# Patient Record
Sex: Female | Born: 1955 | Race: Black or African American | Hispanic: No | Marital: Married | Smoking: Former smoker
Health system: Southern US, Community
[De-identification: ages and names within clinical notes are randomized; demographics above are authoritative.]

## PROBLEM LIST (undated history)

## (undated) DIAGNOSIS — E669 Obesity, unspecified: Secondary | ICD-10-CM

## (undated) DIAGNOSIS — Z9889 Other specified postprocedural states: Secondary | ICD-10-CM

## (undated) DIAGNOSIS — D649 Anemia, unspecified: Secondary | ICD-10-CM

## (undated) DIAGNOSIS — M1710 Unilateral primary osteoarthritis, unspecified knee: Secondary | ICD-10-CM

## (undated) DIAGNOSIS — E7800 Pure hypercholesterolemia, unspecified: Secondary | ICD-10-CM

## (undated) DIAGNOSIS — I10 Essential (primary) hypertension: Secondary | ICD-10-CM

## (undated) DIAGNOSIS — C801 Malignant (primary) neoplasm, unspecified: Secondary | ICD-10-CM

## (undated) HISTORY — DX: Malignant (primary) neoplasm, unspecified: C80.1

## (undated) HISTORY — PX: ESOPHAGOGASTRODUODENOSCOPY: SHX1529

## (undated) HISTORY — PX: OTHER SURGICAL HISTORY: SHX169

---

## 1979-02-14 HISTORY — PX: OTHER SURGICAL HISTORY: SHX169

## 1997-08-06 ENCOUNTER — Other Ambulatory Visit: Admission: RE | Admit: 1997-08-06 | Discharge: 1997-08-06 | Payer: Self-pay | Admitting: Family Medicine

## 1997-12-09 ENCOUNTER — Emergency Department (HOSPITAL_COMMUNITY): Admission: EM | Admit: 1997-12-09 | Discharge: 1997-12-09 | Payer: Self-pay | Admitting: Emergency Medicine

## 1997-12-09 ENCOUNTER — Encounter: Payer: Self-pay | Admitting: Emergency Medicine

## 2002-03-10 ENCOUNTER — Encounter: Payer: Self-pay | Admitting: Emergency Medicine

## 2002-03-10 ENCOUNTER — Emergency Department (HOSPITAL_COMMUNITY): Admission: EM | Admit: 2002-03-10 | Discharge: 2002-03-10 | Payer: Self-pay | Admitting: Emergency Medicine

## 2002-08-25 ENCOUNTER — Emergency Department (HOSPITAL_COMMUNITY): Admission: EM | Admit: 2002-08-25 | Discharge: 2002-08-26 | Payer: Self-pay | Admitting: Emergency Medicine

## 2002-08-26 ENCOUNTER — Encounter: Payer: Self-pay | Admitting: Emergency Medicine

## 2003-03-10 ENCOUNTER — Encounter: Admission: RE | Admit: 2003-03-10 | Discharge: 2003-05-22 | Payer: Self-pay | Admitting: Orthopedic Surgery

## 2003-04-01 ENCOUNTER — Emergency Department (HOSPITAL_COMMUNITY): Admission: EM | Admit: 2003-04-01 | Discharge: 2003-04-01 | Payer: Self-pay | Admitting: Emergency Medicine

## 2003-04-02 ENCOUNTER — Emergency Department (HOSPITAL_COMMUNITY): Admission: EM | Admit: 2003-04-02 | Discharge: 2003-04-02 | Payer: Self-pay | Admitting: Emergency Medicine

## 2003-04-03 ENCOUNTER — Emergency Department (HOSPITAL_COMMUNITY): Admission: EM | Admit: 2003-04-03 | Discharge: 2003-04-03 | Payer: Self-pay | Admitting: Emergency Medicine

## 2003-11-16 ENCOUNTER — Ambulatory Visit: Payer: Self-pay | Admitting: Family Medicine

## 2004-01-05 ENCOUNTER — Ambulatory Visit: Payer: Self-pay | Admitting: Family Medicine

## 2004-01-05 ENCOUNTER — Other Ambulatory Visit: Admission: RE | Admit: 2004-01-05 | Discharge: 2004-01-05 | Payer: Self-pay | Admitting: Family Medicine

## 2004-03-16 ENCOUNTER — Ambulatory Visit: Payer: Self-pay | Admitting: Family Medicine

## 2004-06-10 ENCOUNTER — Ambulatory Visit: Payer: Self-pay | Admitting: Family Medicine

## 2004-08-05 ENCOUNTER — Emergency Department (HOSPITAL_COMMUNITY): Admission: EM | Admit: 2004-08-05 | Discharge: 2004-08-05 | Payer: Self-pay | Admitting: Emergency Medicine

## 2004-08-09 ENCOUNTER — Ambulatory Visit: Payer: Self-pay | Admitting: *Deleted

## 2004-12-07 ENCOUNTER — Ambulatory Visit: Payer: Self-pay | Admitting: Family Medicine

## 2005-02-01 ENCOUNTER — Ambulatory Visit (HOSPITAL_COMMUNITY): Admission: RE | Admit: 2005-02-01 | Discharge: 2005-02-01 | Payer: Self-pay | Admitting: Internal Medicine

## 2005-02-01 ENCOUNTER — Ambulatory Visit: Payer: Self-pay | Admitting: Family Medicine

## 2005-02-09 ENCOUNTER — Ambulatory Visit (HOSPITAL_COMMUNITY): Admission: RE | Admit: 2005-02-09 | Discharge: 2005-02-09 | Payer: Self-pay | Admitting: Internal Medicine

## 2005-02-13 HISTORY — PX: KNEE ARTHROSCOPY: SUR90

## 2005-07-03 ENCOUNTER — Ambulatory Visit: Payer: Self-pay | Admitting: Family Medicine

## 2005-08-10 ENCOUNTER — Ambulatory Visit: Payer: Self-pay | Admitting: Family Medicine

## 2005-09-06 ENCOUNTER — Ambulatory Visit: Payer: Self-pay | Admitting: Family Medicine

## 2005-09-29 ENCOUNTER — Encounter: Admission: RE | Admit: 2005-09-29 | Discharge: 2005-11-09 | Payer: Self-pay | Admitting: Family Medicine

## 2005-10-16 ENCOUNTER — Emergency Department (HOSPITAL_COMMUNITY): Admission: EM | Admit: 2005-10-16 | Discharge: 2005-10-16 | Payer: Self-pay | Admitting: Emergency Medicine

## 2005-10-17 ENCOUNTER — Ambulatory Visit: Payer: Self-pay | Admitting: Family Medicine

## 2005-11-28 ENCOUNTER — Ambulatory Visit: Payer: Self-pay | Admitting: Family Medicine

## 2005-11-29 ENCOUNTER — Encounter: Admission: RE | Admit: 2005-11-29 | Discharge: 2006-02-27 | Payer: Self-pay | Admitting: Family Medicine

## 2006-01-16 ENCOUNTER — Ambulatory Visit: Payer: Self-pay | Admitting: Family Medicine

## 2006-03-26 ENCOUNTER — Encounter: Admission: RE | Admit: 2006-03-26 | Discharge: 2006-06-24 | Payer: Self-pay | Admitting: Family Medicine

## 2006-04-03 ENCOUNTER — Encounter: Admission: RE | Admit: 2006-04-03 | Discharge: 2006-05-21 | Payer: Self-pay | Admitting: Orthopaedic Surgery

## 2006-07-31 ENCOUNTER — Ambulatory Visit: Payer: Self-pay | Admitting: Family Medicine

## 2006-11-27 ENCOUNTER — Ambulatory Visit: Payer: Self-pay | Admitting: Family Medicine

## 2007-01-05 IMAGING — CR DG CHEST 2V
2 series · 2 of 2 positions shown · non-contrast
Comparison: 04/03/2003

CLINICAL DATA: Chest pain

CHEST - 2 VIEW:

[w chest pa]
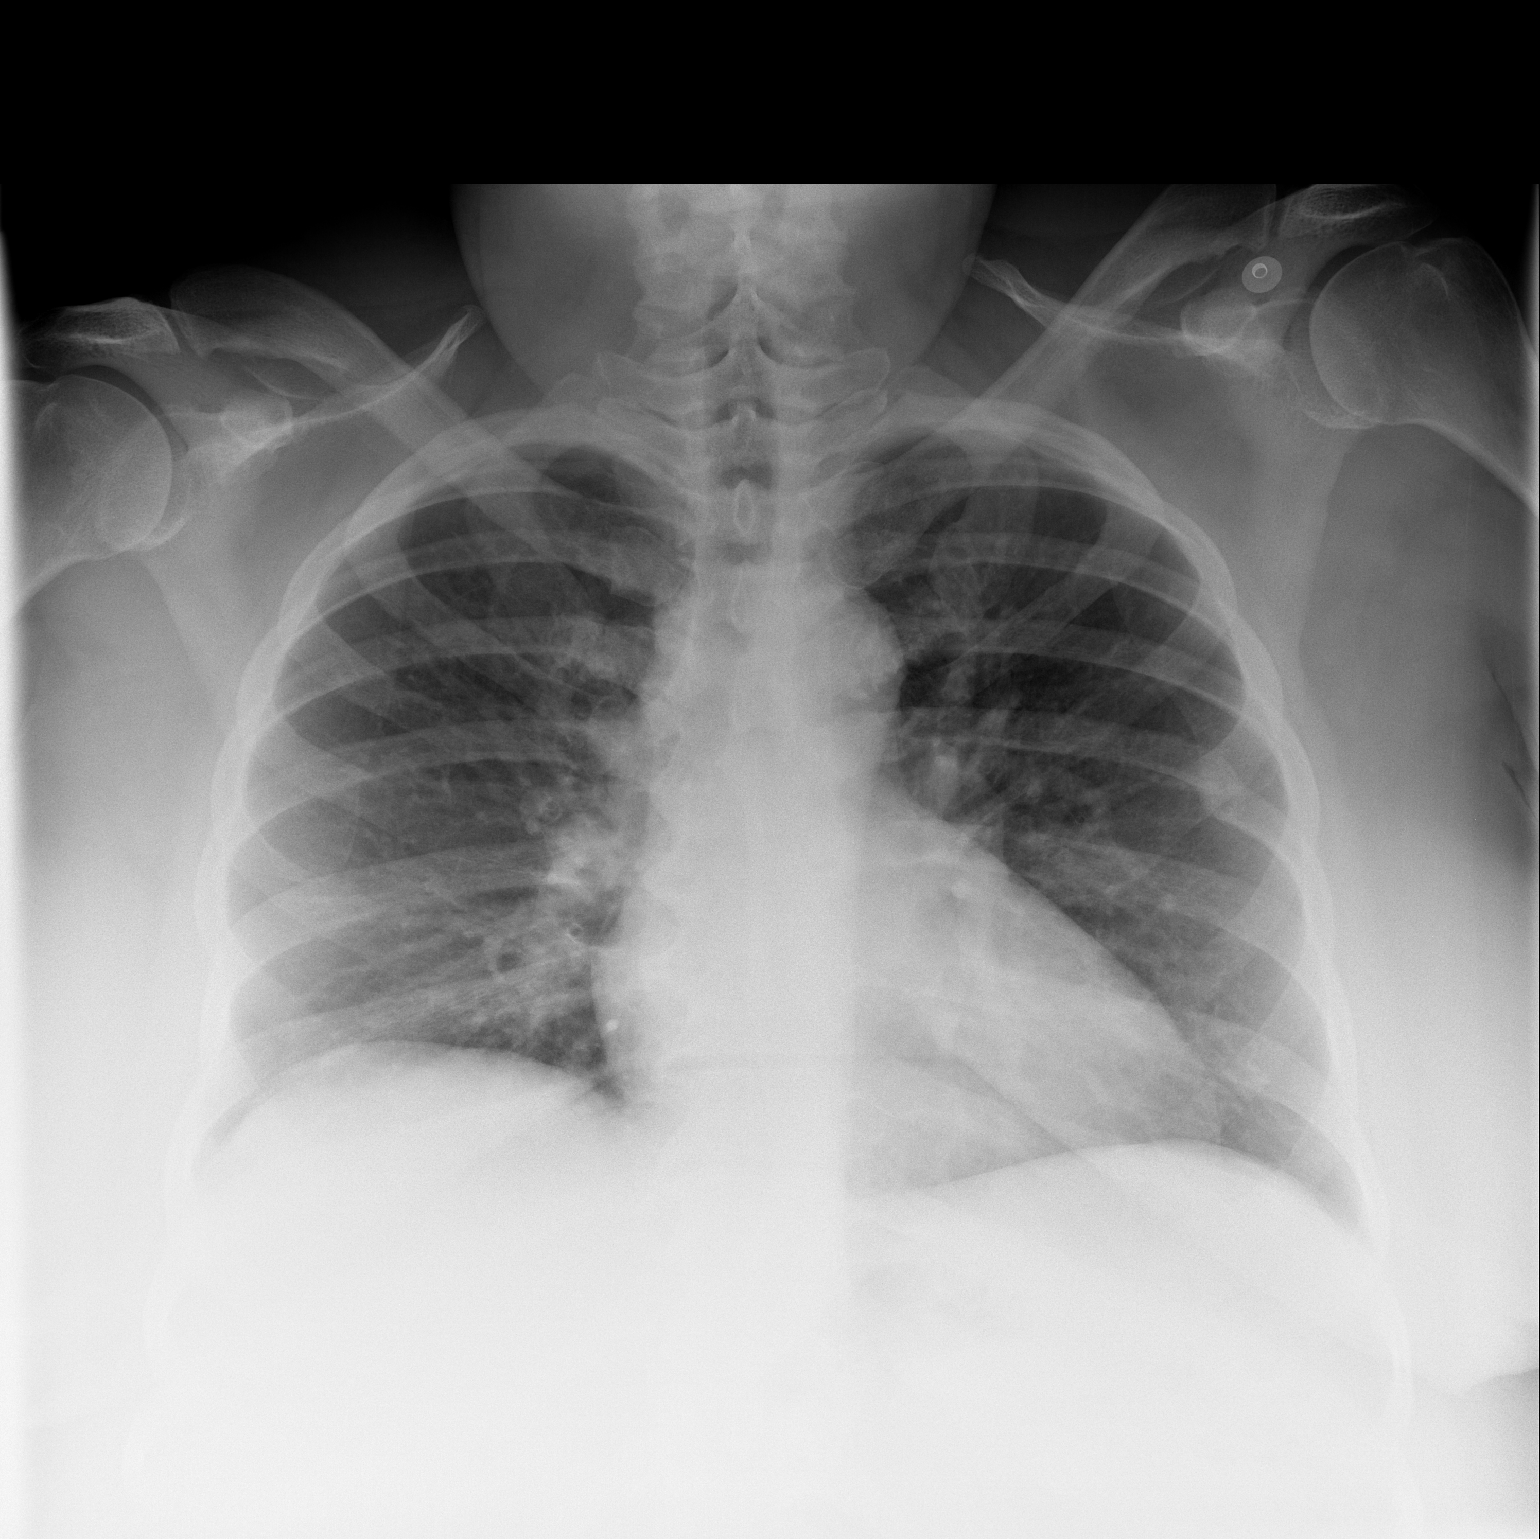

[w chest lat]
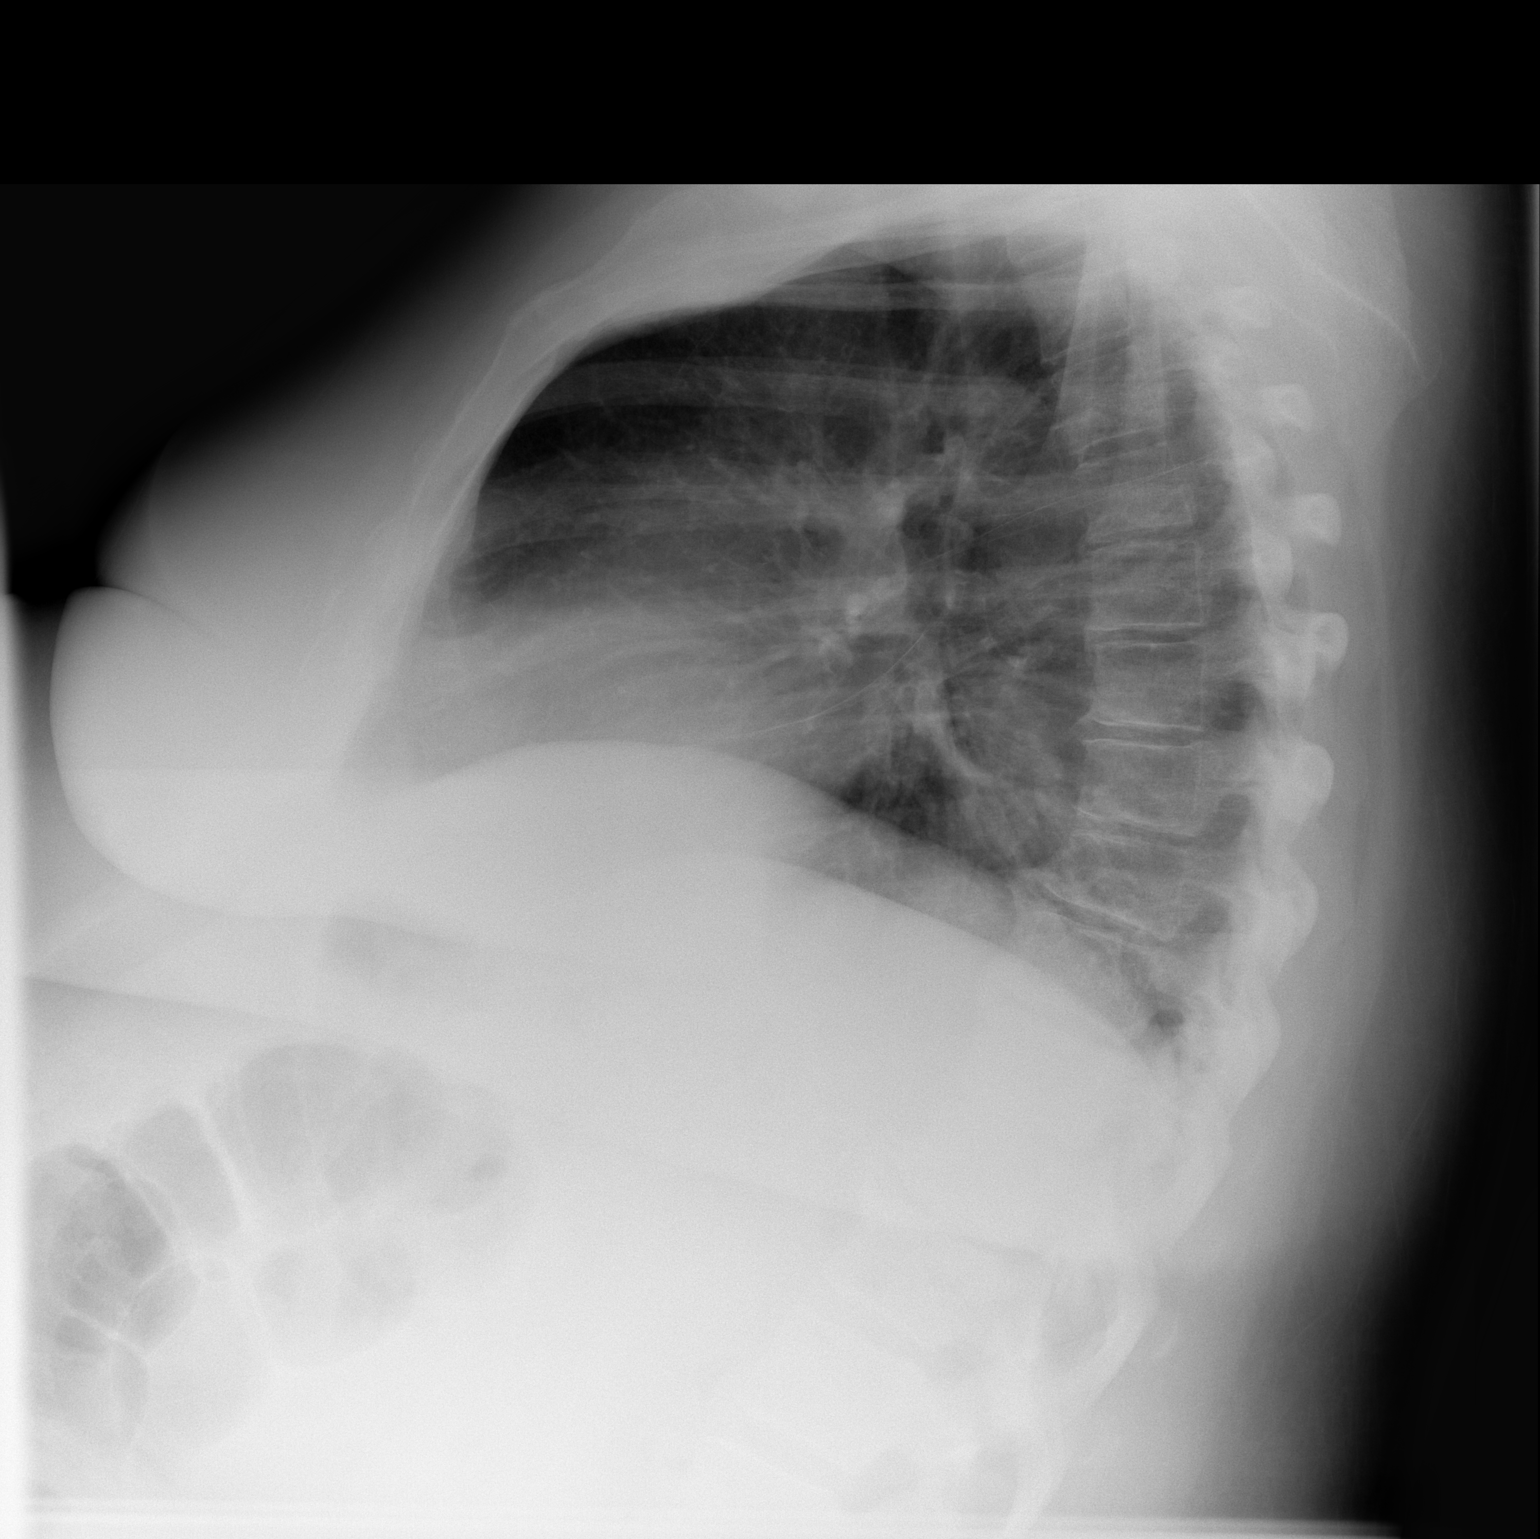

[2 of 2 positions shown; findings below may reference images not displayed]

FINDINGS: Heart is mildly enlarged. There is peribronchial thickening. No focal
opacities or effusions. Old left 6th rib fracture again noted, unchanged.
IMPRESSION: Mild cardiomegaly and bronchitic changes.

## 2007-01-08 ENCOUNTER — Ambulatory Visit: Payer: Self-pay | Admitting: Internal Medicine

## 2007-01-08 ENCOUNTER — Encounter (INDEPENDENT_AMBULATORY_CARE_PROVIDER_SITE_OTHER): Payer: Self-pay | Admitting: Family Medicine

## 2007-01-08 LAB — CONVERTED CEMR LAB
BUN: 20 mg/dL (ref 6–23)
CO2: 23 meq/L (ref 19–32)
Cholesterol: 154 mg/dL (ref 0–200)
Creatinine, Ser: 0.92 mg/dL (ref 0.40–1.20)
Glucose, Bld: 369 mg/dL — ABNORMAL HIGH (ref 70–99)
HDL: 51 mg/dL (ref >39)
Total Bilirubin: 0.3 mg/dL (ref 0.3–1.2)
Total CHOL/HDL Ratio: 3
Total Protein: 7.3 g/dL (ref 6.0–8.3)
Triglycerides: 147 mg/dL (ref <150)
VLDL: 29 mg/dL (ref 0–40)

## 2007-01-29 ENCOUNTER — Ambulatory Visit: Payer: Self-pay | Admitting: Internal Medicine

## 2007-02-12 ENCOUNTER — Ambulatory Visit: Payer: Self-pay | Admitting: Family Medicine

## 2007-04-30 ENCOUNTER — Ambulatory Visit: Payer: Self-pay | Admitting: Family Medicine

## 2007-05-09 ENCOUNTER — Emergency Department (HOSPITAL_COMMUNITY): Admission: EM | Admit: 2007-05-09 | Discharge: 2007-05-09 | Payer: Self-pay | Admitting: Family Medicine

## 2007-06-10 ENCOUNTER — Ambulatory Visit: Payer: Self-pay | Admitting: Internal Medicine

## 2007-07-01 ENCOUNTER — Ambulatory Visit: Payer: Self-pay | Admitting: Internal Medicine

## 2007-09-05 ENCOUNTER — Ambulatory Visit: Payer: Self-pay | Admitting: Internal Medicine

## 2007-09-09 ENCOUNTER — Ambulatory Visit: Payer: Self-pay | Admitting: Internal Medicine

## 2007-09-11 ENCOUNTER — Ambulatory Visit: Payer: Self-pay | Admitting: Internal Medicine

## 2007-09-13 ENCOUNTER — Ambulatory Visit: Payer: Self-pay | Admitting: Internal Medicine

## 2007-09-20 ENCOUNTER — Ambulatory Visit: Payer: Self-pay | Admitting: Internal Medicine

## 2007-09-23 ENCOUNTER — Ambulatory Visit: Payer: Self-pay | Admitting: Internal Medicine

## 2007-09-25 ENCOUNTER — Ambulatory Visit: Payer: Self-pay | Admitting: Surgery

## 2007-09-25 ENCOUNTER — Encounter (INDEPENDENT_AMBULATORY_CARE_PROVIDER_SITE_OTHER): Payer: Self-pay | Admitting: Emergency Medicine

## 2007-09-25 ENCOUNTER — Emergency Department (HOSPITAL_COMMUNITY): Admission: EM | Admit: 2007-09-25 | Discharge: 2007-09-25 | Payer: Self-pay | Admitting: Emergency Medicine

## 2007-09-27 ENCOUNTER — Ambulatory Visit: Payer: Self-pay | Admitting: Internal Medicine

## 2007-09-28 ENCOUNTER — Encounter: Payer: Self-pay | Admitting: Family Medicine

## 2007-09-30 ENCOUNTER — Ambulatory Visit: Payer: Self-pay | Admitting: Internal Medicine

## 2007-10-02 ENCOUNTER — Ambulatory Visit: Payer: Self-pay | Admitting: Internal Medicine

## 2007-10-04 ENCOUNTER — Ambulatory Visit: Payer: Self-pay | Admitting: Internal Medicine

## 2007-10-07 ENCOUNTER — Ambulatory Visit: Payer: Self-pay | Admitting: Family Medicine

## 2007-10-09 ENCOUNTER — Ambulatory Visit: Payer: Self-pay | Admitting: Internal Medicine

## 2007-10-11 ENCOUNTER — Ambulatory Visit: Payer: Self-pay | Admitting: Internal Medicine

## 2007-10-17 ENCOUNTER — Ambulatory Visit: Payer: Self-pay | Admitting: Internal Medicine

## 2007-10-24 ENCOUNTER — Ambulatory Visit: Payer: Self-pay | Admitting: Internal Medicine

## 2007-10-30 ENCOUNTER — Ambulatory Visit: Payer: Self-pay | Admitting: Internal Medicine

## 2007-11-05 ENCOUNTER — Ambulatory Visit: Payer: Self-pay | Admitting: Internal Medicine

## 2007-11-07 ENCOUNTER — Ambulatory Visit: Payer: Self-pay | Admitting: Internal Medicine

## 2007-11-11 ENCOUNTER — Ambulatory Visit: Payer: Self-pay | Admitting: Internal Medicine

## 2007-11-15 ENCOUNTER — Ambulatory Visit: Payer: Self-pay | Admitting: Internal Medicine

## 2007-11-20 ENCOUNTER — Ambulatory Visit: Payer: Self-pay | Admitting: Internal Medicine

## 2007-11-27 ENCOUNTER — Ambulatory Visit: Payer: Self-pay | Admitting: Internal Medicine

## 2007-12-03 ENCOUNTER — Ambulatory Visit: Payer: Self-pay | Admitting: Internal Medicine

## 2007-12-10 ENCOUNTER — Ambulatory Visit: Payer: Self-pay | Admitting: Internal Medicine

## 2007-12-17 ENCOUNTER — Ambulatory Visit: Payer: Self-pay | Admitting: Internal Medicine

## 2007-12-25 ENCOUNTER — Ambulatory Visit: Payer: Self-pay | Admitting: Internal Medicine

## 2007-12-30 ENCOUNTER — Ambulatory Visit: Payer: Self-pay | Admitting: Internal Medicine

## 2008-01-06 ENCOUNTER — Ambulatory Visit: Payer: Self-pay | Admitting: Internal Medicine

## 2008-01-14 ENCOUNTER — Ambulatory Visit: Payer: Self-pay | Admitting: Internal Medicine

## 2008-01-23 ENCOUNTER — Ambulatory Visit: Payer: Self-pay | Admitting: Internal Medicine

## 2008-02-03 ENCOUNTER — Ambulatory Visit: Payer: Self-pay | Admitting: Internal Medicine

## 2008-02-11 ENCOUNTER — Ambulatory Visit: Payer: Self-pay | Admitting: Internal Medicine

## 2008-02-11 ENCOUNTER — Encounter (INDEPENDENT_AMBULATORY_CARE_PROVIDER_SITE_OTHER): Payer: Self-pay | Admitting: Family Medicine

## 2008-02-11 LAB — CONVERTED CEMR LAB
ALT: 13 units/L (ref 0–35)
AST: 13 units/L (ref 0–37)
Basophils Absolute: 0 10*3/uL (ref 0.0–0.1)
Basophils Relative: 0 % (ref 0–1)
Chloride: 108 meq/L (ref 96–112)
Creatinine, Ser: 0.79 mg/dL (ref 0.40–1.20)
Hemoglobin: 10.6 g/dL — ABNORMAL LOW (ref 12.0–15.0)
MCHC: 30 g/dL (ref 30.0–36.0)
Microalb, Ur: 1.53 mg/dL (ref 0.00–1.89)
Monocytes Absolute: 0.6 10*3/uL (ref 0.1–1.0)
Neutro Abs: 2.6 10*3/uL (ref 1.7–7.7)
Platelets: 254 10*3/uL (ref 150–400)
RDW: 15.5 % (ref 11.5–15.5)
TSH: 3.307 microintl units/mL (ref 0.350–4.50)
Total Bilirubin: 0.3 mg/dL (ref 0.3–1.2)
Total CHOL/HDL Ratio: 2.8
VLDL: 17 mg/dL (ref 0–40)

## 2008-02-12 ENCOUNTER — Encounter (INDEPENDENT_AMBULATORY_CARE_PROVIDER_SITE_OTHER): Payer: Self-pay | Admitting: Family Medicine

## 2008-02-17 ENCOUNTER — Ambulatory Visit: Payer: Self-pay | Admitting: Internal Medicine

## 2008-02-21 ENCOUNTER — Encounter: Payer: Self-pay | Admitting: Family Medicine

## 2008-02-21 ENCOUNTER — Ambulatory Visit: Payer: Self-pay | Admitting: Internal Medicine

## 2008-02-24 ENCOUNTER — Ambulatory Visit: Payer: Self-pay | Admitting: Internal Medicine

## 2008-03-06 ENCOUNTER — Ambulatory Visit (HOSPITAL_COMMUNITY): Admission: RE | Admit: 2008-03-06 | Discharge: 2008-03-06 | Payer: Self-pay | Admitting: Internal Medicine

## 2008-03-11 ENCOUNTER — Encounter (HOSPITAL_BASED_OUTPATIENT_CLINIC_OR_DEPARTMENT_OTHER): Admission: RE | Admit: 2008-03-11 | Discharge: 2008-06-09 | Payer: Self-pay | Admitting: Internal Medicine

## 2008-03-27 ENCOUNTER — Encounter: Admission: RE | Admit: 2008-03-27 | Discharge: 2008-06-25 | Payer: Self-pay | Admitting: Family Medicine

## 2008-04-15 ENCOUNTER — Ambulatory Visit: Payer: Self-pay | Admitting: Internal Medicine

## 2008-06-12 ENCOUNTER — Encounter (HOSPITAL_BASED_OUTPATIENT_CLINIC_OR_DEPARTMENT_OTHER): Admission: RE | Admit: 2008-06-12 | Discharge: 2008-09-10 | Payer: Self-pay | Admitting: Internal Medicine

## 2008-07-06 ENCOUNTER — Encounter: Admission: RE | Admit: 2008-07-06 | Discharge: 2008-10-04 | Payer: Self-pay | Admitting: Family Medicine

## 2008-07-15 ENCOUNTER — Ambulatory Visit: Payer: Self-pay | Admitting: Family Medicine

## 2008-07-15 LAB — CONVERTED CEMR LAB
ALT: 17 units/L (ref 0–35)
AST: 18 units/L (ref 0–37)
Alkaline Phosphatase: 62 units/L (ref 39–117)
Calcium: 10.2 mg/dL (ref 8.4–10.5)
Chloride: 107 meq/L (ref 96–112)
Creatinine, Ser: 0.8 mg/dL (ref 0.40–1.20)
Eosinophils Absolute: 0 10*3/uL (ref 0.0–0.7)
Iron: 44 ug/dL (ref 42–145)
Lymphocytes Relative: 37 % (ref 12–46)
Lymphs Abs: 1.8 10*3/uL (ref 0.7–4.0)
Neutrophils Relative %: 53 % (ref 43–77)
Platelets: 271 10*3/uL (ref 150–400)
Potassium: 4.1 meq/L (ref 3.5–5.3)
Saturation Ratios: 11 % — ABNORMAL LOW (ref 20–55)
UIBC: 352 ug/dL
WBC: 4.7 10*3/uL (ref 4.0–10.5)

## 2008-07-22 ENCOUNTER — Ambulatory Visit: Payer: Self-pay | Admitting: Internal Medicine

## 2008-08-16 ENCOUNTER — Emergency Department (HOSPITAL_COMMUNITY): Admission: EM | Admit: 2008-08-16 | Discharge: 2008-08-17 | Payer: Self-pay | Admitting: Emergency Medicine

## 2008-09-14 ENCOUNTER — Encounter (HOSPITAL_BASED_OUTPATIENT_CLINIC_OR_DEPARTMENT_OTHER): Admission: RE | Admit: 2008-09-14 | Discharge: 2008-10-21 | Payer: Self-pay | Admitting: Internal Medicine

## 2008-09-18 ENCOUNTER — Telehealth (INDEPENDENT_AMBULATORY_CARE_PROVIDER_SITE_OTHER): Payer: Self-pay | Admitting: *Deleted

## 2008-09-21 ENCOUNTER — Ambulatory Visit: Payer: Self-pay | Admitting: Internal Medicine

## 2008-09-22 ENCOUNTER — Emergency Department (HOSPITAL_COMMUNITY): Admission: EM | Admit: 2008-09-22 | Discharge: 2008-09-22 | Payer: Self-pay | Admitting: Family Medicine

## 2008-09-28 ENCOUNTER — Ambulatory Visit (HOSPITAL_COMMUNITY): Admission: RE | Admit: 2008-09-28 | Discharge: 2008-09-28 | Payer: Self-pay | Admitting: Internal Medicine

## 2008-09-28 ENCOUNTER — Ambulatory Visit: Payer: Self-pay | Admitting: Internal Medicine

## 2008-09-28 ENCOUNTER — Telehealth (INDEPENDENT_AMBULATORY_CARE_PROVIDER_SITE_OTHER): Payer: Self-pay | Admitting: *Deleted

## 2008-11-06 ENCOUNTER — Ambulatory Visit: Payer: Self-pay | Admitting: Internal Medicine

## 2008-11-13 ENCOUNTER — Ambulatory Visit (HOSPITAL_COMMUNITY): Admission: RE | Admit: 2008-11-13 | Discharge: 2008-11-13 | Payer: Self-pay | Admitting: Family Medicine

## 2008-11-17 ENCOUNTER — Ambulatory Visit: Payer: Self-pay | Admitting: Gastroenterology

## 2008-11-18 ENCOUNTER — Encounter: Payer: Self-pay | Admitting: Family Medicine

## 2008-11-18 ENCOUNTER — Ambulatory Visit: Payer: Self-pay | Admitting: Internal Medicine

## 2008-11-18 LAB — CONVERTED CEMR LAB
Alkaline Phosphatase: 50 units/L (ref 39–117)
BUN: 24 mg/dL — ABNORMAL HIGH (ref 6–23)
Basophils Absolute: 0 10*3/uL (ref 0.0–0.1)
Basophils Relative: 1 % (ref 0–1)
CO2: 26 meq/L (ref 19–32)
Cholesterol: 107 mg/dL (ref 0–200)
Creatinine, Ser: 0.97 mg/dL (ref 0.40–1.20)
Eosinophils Absolute: 0.1 10*3/uL (ref 0.0–0.7)
Eosinophils Relative: 1 % (ref 0–5)
Glucose, Bld: 88 mg/dL (ref 70–99)
HDL: 48 mg/dL (ref >39)
Hemoglobin: 11.1 g/dL — ABNORMAL LOW (ref 12.0–15.0)
LDL Cholesterol: 48 mg/dL (ref 0–99)
MCHC: 29.9 g/dL — ABNORMAL LOW (ref 30.0–36.0)
MCV: 89 fL (ref 78.0–100.0)
Monocytes Absolute: 0.5 10*3/uL (ref 0.1–1.0)
Neutro Abs: 1.8 10*3/uL (ref 1.7–7.7)
RDW: 15.6 % — ABNORMAL HIGH (ref 11.5–15.5)
Sodium: 144 meq/L (ref 135–145)
Total Bilirubin: 0.3 mg/dL (ref 0.3–1.2)
Triglycerides: 56 mg/dL (ref <150)
VLDL: 11 mg/dL (ref 0–40)

## 2008-11-30 ENCOUNTER — Ambulatory Visit: Payer: Self-pay | Admitting: Gastroenterology

## 2008-12-31 ENCOUNTER — Encounter: Admission: RE | Admit: 2008-12-31 | Discharge: 2009-02-10 | Payer: Self-pay | Admitting: Family Medicine

## 2009-01-15 ENCOUNTER — Ambulatory Visit: Payer: Self-pay | Admitting: Internal Medicine

## 2009-01-15 ENCOUNTER — Encounter: Payer: Self-pay | Admitting: Family Medicine

## 2009-01-20 ENCOUNTER — Ambulatory Visit: Payer: Self-pay | Admitting: Obstetrics and Gynecology

## 2009-01-20 ENCOUNTER — Other Ambulatory Visit: Admission: RE | Admit: 2009-01-20 | Discharge: 2009-01-20 | Payer: Self-pay | Admitting: Obstetrics and Gynecology

## 2009-02-03 ENCOUNTER — Ambulatory Visit: Payer: Self-pay | Admitting: Obstetrics & Gynecology

## 2009-02-25 ENCOUNTER — Encounter: Admission: RE | Admit: 2009-02-25 | Discharge: 2009-05-26 | Payer: Self-pay | Admitting: Endocrinology

## 2009-03-29 ENCOUNTER — Ambulatory Visit (HOSPITAL_COMMUNITY): Admission: RE | Admit: 2009-03-29 | Discharge: 2009-03-29 | Payer: Self-pay | Admitting: Internal Medicine

## 2009-04-15 ENCOUNTER — Ambulatory Visit: Payer: Self-pay | Admitting: Internal Medicine

## 2009-05-17 ENCOUNTER — Ambulatory Visit: Payer: Self-pay | Admitting: Internal Medicine

## 2009-08-31 ENCOUNTER — Ambulatory Visit: Payer: Self-pay | Admitting: Family Medicine

## 2009-09-27 ENCOUNTER — Encounter: Admission: RE | Admit: 2009-09-27 | Discharge: 2009-11-12 | Payer: Self-pay | Admitting: Family Medicine

## 2009-10-01 ENCOUNTER — Ambulatory Visit: Payer: Self-pay | Admitting: Internal Medicine

## 2009-10-01 LAB — CONVERTED CEMR LAB
AST: 15 units/L (ref 0–37)
Albumin: 4.3 g/dL (ref 3.5–5.2)
Alkaline Phosphatase: 60 units/L (ref 39–117)
BUN: 20 mg/dL (ref 6–23)
Basophils Relative: 1 % (ref 0–1)
Calcium: 9.8 mg/dL (ref 8.4–10.5)
Chloride: 107 meq/L (ref 96–112)
Eosinophils Absolute: 0 10*3/uL (ref 0.0–0.7)
Ferritin: 21 ng/mL (ref 10–291)
Free T4: 1.18 ng/dL (ref 0.80–1.80)
HDL: 53 mg/dL (ref >39)
Hgb A1c MFr Bld: 8.3 % — ABNORMAL HIGH (ref <5.7)
Iron: 53 ug/dL (ref 42–145)
LDL Cholesterol: 55 mg/dL (ref 0–99)
Lymphs Abs: 1.6 10*3/uL (ref 0.7–4.0)
MCHC: 30.9 g/dL (ref 30.0–36.0)
MCV: 87.4 fL (ref 78.0–100.0)
Monocytes Relative: 10 % (ref 3–12)
Neutro Abs: 2.1 10*3/uL (ref 1.7–7.7)
Neutrophils Relative %: 50 % (ref 43–77)
Platelets: 247 10*3/uL (ref 150–400)
Potassium: 4.4 meq/L (ref 3.5–5.3)
RBC: 4.37 M/uL (ref 3.87–5.11)
Sodium: 142 meq/L (ref 135–145)
TIBC: 386 ug/dL (ref 250–470)
TSH: 3.964 microintl units/mL (ref 0.350–4.500)
Total Protein: 7.2 g/dL (ref 6.0–8.3)
UIBC: 333 ug/dL
Uric Acid, Serum: 5.3 mg/dL (ref 2.4–7.0)
WBC: 4.2 10*3/uL (ref 4.0–10.5)

## 2009-10-15 ENCOUNTER — Ambulatory Visit: Payer: Self-pay | Admitting: Internal Medicine

## 2009-10-19 ENCOUNTER — Ambulatory Visit: Payer: Self-pay | Admitting: Internal Medicine

## 2009-10-22 ENCOUNTER — Ambulatory Visit: Payer: Self-pay | Admitting: Internal Medicine

## 2009-12-10 ENCOUNTER — Emergency Department (HOSPITAL_COMMUNITY): Admission: EM | Admit: 2009-12-10 | Discharge: 2009-12-10 | Payer: Self-pay | Admitting: Family Medicine

## 2010-03-06 ENCOUNTER — Encounter: Payer: Self-pay | Admitting: Internal Medicine

## 2010-03-07 ENCOUNTER — Encounter (INDEPENDENT_AMBULATORY_CARE_PROVIDER_SITE_OTHER): Payer: Self-pay | Admitting: *Deleted

## 2010-03-11 ENCOUNTER — Other Ambulatory Visit (HOSPITAL_COMMUNITY): Payer: Self-pay | Admitting: Family Medicine

## 2010-03-11 DIAGNOSIS — Z139 Encounter for screening, unspecified: Secondary | ICD-10-CM

## 2010-03-11 DIAGNOSIS — Z1231 Encounter for screening mammogram for malignant neoplasm of breast: Secondary | ICD-10-CM

## 2010-03-30 ENCOUNTER — Ambulatory Visit (HOSPITAL_COMMUNITY)
Admission: RE | Admit: 2010-03-30 | Discharge: 2010-03-30 | Disposition: A | Discharge status: Home / Self Care | Payer: Medicare Other | Source: Ambulatory Visit | Attending: Family Medicine | Admitting: Family Medicine

## 2010-03-30 ENCOUNTER — Ambulatory Visit (HOSPITAL_COMMUNITY): Admission: RE | Admit: 2010-03-30 | Payer: Self-pay | Source: Home / Self Care | Admitting: Internal Medicine

## 2010-03-30 DIAGNOSIS — Z1231 Encounter for screening mammogram for malignant neoplasm of breast: Secondary | ICD-10-CM | POA: Insufficient documentation

## 2010-04-27 LAB — POCT URINALYSIS DIPSTICK
Hgb urine dipstick: NEGATIVE
Nitrite: NEGATIVE
Protein, ur: NEGATIVE mg/dL
Specific Gravity, Urine: 1.015 (ref 1.005–1.030)
Urobilinogen, UA: 0.2 mg/dL (ref 0.0–1.0)
pH: 6 (ref 5.0–8.0)

## 2010-05-19 LAB — GLUCOSE, CAPILLARY: Glucose-Capillary: 82 mg/dL (ref 70–99)

## 2010-05-22 LAB — DIFFERENTIAL
Eosinophils Absolute: 0.1 10*3/uL (ref 0.0–0.7)
Eosinophils Relative: 1 % (ref 0–5)
Lymphs Abs: 2 10*3/uL (ref 0.7–4.0)
Monocytes Relative: 9 % (ref 3–12)

## 2010-05-22 LAB — COMPREHENSIVE METABOLIC PANEL
ALT: 20 U/L (ref 0–35)
AST: 30 U/L (ref 0–37)
Alkaline Phosphatase: 65 U/L (ref 39–117)
CO2: 26 mEq/L (ref 19–32)
Calcium: 9.7 mg/dL (ref 8.4–10.5)
GFR calc Af Amer: 44 mL/min — ABNORMAL LOW (ref >60)
Potassium: 3.8 mEq/L (ref 3.5–5.1)
Sodium: 140 mEq/L (ref 135–145)
Total Protein: 7.7 g/dL (ref 6.0–8.3)

## 2010-05-22 LAB — URINALYSIS, ROUTINE W REFLEX MICROSCOPIC
Hgb urine dipstick: NEGATIVE
Specific Gravity, Urine: 1.023 (ref 1.005–1.030)
Urobilinogen, UA: 1 mg/dL (ref 0.0–1.0)
pH: 5.5 (ref 5.0–8.0)

## 2010-05-22 LAB — CBC
MCHC: 32.6 g/dL (ref 30.0–36.0)
RBC: 4.71 MIL/uL (ref 3.87–5.11)
RDW: 15.9 % — ABNORMAL HIGH (ref 11.5–15.5)

## 2010-05-22 LAB — URINE MICROSCOPIC-ADD ON

## 2010-06-23 ENCOUNTER — Inpatient Hospital Stay (INDEPENDENT_AMBULATORY_CARE_PROVIDER_SITE_OTHER)
Admission: RE | Admit: 2010-06-23 | Discharge: 2010-06-23 | Disposition: A | Discharge status: Home / Self Care | Payer: Medicare Other | Source: Ambulatory Visit | Attending: Family Medicine | Admitting: Family Medicine

## 2010-06-23 ENCOUNTER — Ambulatory Visit (INDEPENDENT_AMBULATORY_CARE_PROVIDER_SITE_OTHER): Payer: Medicare Other

## 2010-06-23 DIAGNOSIS — M79609 Pain in unspecified limb: Secondary | ICD-10-CM

## 2010-06-28 NOTE — Assessment & Plan Note (Signed)
[facility]   NAME:  Katie West, Katie West               ACCOUNT NO.:  [id number]   MEDICAL RECORD NO.:  [id number]      DATE OF BIRTH:  07/07/1955   PHYSICIAN:  Maxwell Caul, M.D.      VISIT DATE:                                   OFFICE VISIT   Katie West is a lady we have been following for venous stasis  ulceration on her left lower extremity.  The patient has been receiving  Prisma under a Profore wrap.  She appears to be tolerating this fairly  well and there are no specific complaints.   On examination, she is afebrile with a temperature of 98.1.  Her glucose  is 186.  The area on the left anterior lower extremity measures 0.3 x  0.3 x 0.1.  There is surrounding significant stasis change; however, she  only has a small open area at this point.   IMPRESSION:  Improving venous stasis ulceration.  We applied hydrogel  gauze, Adaptic, and a Profore wrap.  We will see her back in a week.  I  am hopeful this area will close down in that time frame.           ______________________________  Maxwell Caul, M.D.     MGR/MEDQ  D:  05/28/2008  T:  05/29/2008  Job:  [id number]

## 2010-06-28 NOTE — Assessment & Plan Note (Signed)
[facility]   NAME:  Katie West, Katie West               ACCOUNT NO.:  [id number]   MEDICAL RECORD NO.:  [id number]      DATE OF BIRTH:  [birth year]   PHYSICIAN:  Leonie Man, M.D.    VISIT DATE:  07/06/2008                                   OFFICE VISIT   PROBLEM:  Venous leg ulcer of left lower extremity.  Current wound  dimensions 1.3 x 1.4 x 0.1, which is significantly reduced on the  current therapy of hydrogel and a Profore Lite wrap.   The patient returns today for reevaluation.  The wound continues to be  clean and fully granulated not requiring any debridement on today's  evaluation.  We will continue her on compression therapy with Profore  Lite wrap, hydrogel, and Adaptic dressing.  She is scheduled for  followup in 1 week.      Leonie Man, M.D.  Electronically Signed     PB/MEDQ  D:  07/08/2008  T:  07/08/2008  Job:  [id number]

## 2010-06-28 NOTE — Assessment & Plan Note (Signed)
[facility]   NAME:  Katie West, Katie West               ACCOUNT NO.:  [id number]   MEDICAL RECORD NO.:  [id number]      DATE OF BIRTH:  Aug 27, 1955   PHYSICIAN:  Leonie Man, M.D.    VISIT DATE:  06/09/2008                                   OFFICE VISIT   PROBLEM:  Venous stasis disease of the left leg in this 55 year old  female, currently being treated with collagen hydrogel and with Profore  wraps.  She is here for followup today.   On examination, the patient is feeling well.  She is afebrile.  Temperature 98, pulse 92, respirations 16, blood pressure 122/81,  capillary blood glucose measured at 193 today.  On evaluation, the wound  of the left anterior leg measures 1.3 x 1.5 x 0.1 which is essentially  unchanged from her most recent visit 1 week ago.  The wound base is,  however, clean without any evidence of exudate, odor, or fibrin.   Additionally, the patient shows a blister over the third toe of her left  foot which she has noted just in the past 24 hours.  The skin of this  area is not broken, and there is no evidence of infection.   Treatment today will consist for her venous leg ulcer of hydrogel under  a Profore dressing.  The toe blister will be treated with a toe sock.  She should be followed up in 1 week when she should return to see Dr.  Baltazar Najjar.      Leonie Man, M.D.  Electronically Signed     PB/MEDQ  D:  06/09/2008  T:  06/10/2008  Job:  [id number]

## 2010-06-28 NOTE — Assessment & Plan Note (Signed)
[facility]   NAME:  Katie West, Katie West               ACCOUNT NO.:  [id number]   MEDICAL RECORD NO.:  [id number]      DATE OF BIRTH:  1956/02/06   PHYSICIAN:  Joanne Gavel, M.D.         VISIT DATE:  08/19/2008                                   OFFICE VISIT   HISTORY OF PRESENT ILLNESS:  A 55 year old female with venous stasis of  the left lower extremity.  There is an ulceration, now 1.9 x 0.9 and it  is quite superficial.   Physical examination reveals temperature 98.2, pulse 100, respirations  20, and blood pressure 108/73.   The ulceration is clean with a great deal of granulation tissue.  This  was cauterized with silver nitrate as I think it is hypertrophic.   Continue same treatment hydrogel, Kerlix, Coban, and venous hose.  See  [place] in 7 days.      Joanne Gavel, M.D.  Electronically Signed     RA/MEDQ  D:  08/19/2008  T:  08/20/2008  Job:  [id number]

## 2010-06-28 NOTE — Assessment & Plan Note (Signed)
[facility]   NAME:  Katie West, Katie West               ACCOUNT NO.:  [id number]   MEDICAL RECORD NO.:  [id number]      DATE OF BIRTH:  05/03/1955   PHYSICIAN:  Lenon Curt. Chilton Si, M.D.   VISIT DATE:  04/24/2008                                   OFFICE VISIT   HISTORY:  A 55 year old female returns for recheck of wounds in left  lower extremity; has venous stasis disease with ulcers.  Original wounds  probably had a traumatic component.  Wounds have continued to improve  and some of them are healed. The patient has no discomfort and no  drainage at the present time.  There continues to be evidence of  hypergranulation tissue at a wound in the mid left shin.   PHYSICAL EXAMINATION:  VITAL SIGNS:  Temperature 98.4, pulse 93,  respirations 20, and blood pressure 110/76.  Capillary glucose 176.  EXTREMITIES:  There are no active wounds except #2 of the left anterior  shin, now measuring 2.0 x 1.7-cm in length and width and there is a  hypergranulated material that actually extends above the surface of the  skin.  Last time she was here, Prisma was applied and the patient  tolerated this procedure well.  There appears to be new skin growth from  the edges of the wound.   TREATMENT:  Reapplication of Prisma, Adaptic gauze, and a Profore.   The patient to return in 1 week for nurse change of the dressings and in  2 weeks for physician recheck.   ICD-9 code 454.2 varicose veins with ulcer and inflammation.  250.01 insulin-dependent diabetes mellitus.   CPT code [address].      Lenon Curt Chilton Si, M.D.  Electronically Signed     AGG/MEDQ  D:  04/24/2008  T:  04/25/2008  Job:  [id number]

## 2010-06-28 NOTE — Assessment & Plan Note (Signed)
[facility]   NAME:  Katie West, Katie West               ACCOUNT NO.:  [id number]   MEDICAL RECORD NO.:  [id number]      DATE OF BIRTH:  12-02-1955   PHYSICIAN:  Jonelle Sports. Sevier, M.D.  VISIT DATE:  09/09/2008                                   OFFICE VISIT   HISTORY:  This 55 year old black female who has been followed for a  venous stasis ulceration that may have had its origin in trauma on the  distal right pretibial area.  This has been treated most recently with  Promogran with hydrogel, Allevyn pad, covered with her compression  stockings.   EXAMINATION:  Today, blood pressure 136/92, pulse 99, respirations 20,  temperature 98.3.  Blood glucose 135 mg/dL.  The ulcer is indeed smaller and innocent in appearance, measuring 0.4 x  0.3 x 0.1 cm.  There is no significant slough, maybe a hint of  hypergranulation.   IMPRESSION:  Rapid move towards resolution of stasis ulcer, left lower  extremity.   DISPOSITION:  The wound will be treated with an application of Silverlon  and covered with a nonstick pad, held in place so as not to slide, and  she will return to her 30-40 compression hose.   We have recommended that she change her dressing every 3 days.   Followup visit is given here for 2 weeks in anticipation that she may  well be healed by that time.           ______________________________  Jonelle Sports Cheryll Cockayne, M.D.     RES/MEDQ  D:  09/09/2008  T:  09/10/2008  Job:  [id number]
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
## 2010-06-28 NOTE — Assessment & Plan Note (Signed)
[facility]   NAME:  Katie West, ZABOROWSKI               ACCOUNT NO.:  [id number]   MEDICAL RECORD NO.:  [id number]      DATE OF BIRTH:  1955-09-05   PHYSICIAN:  Leonie Man, M.D.    VISIT DATE:  06/29/2008                                   OFFICE VISIT   PROBLEM:  Venous leg ulcers of the left lower extremity with a wound  dimension of 1.5 x 1.8 x 0.1 cm.   HISTORY:  Ms. Katie West is a 55 year old lady with a history of  diabetes type 2, hypertension, and associated morbid obesity, has  returned for care of her venous leg ulcer.   Last wound care visit was 1 week ago and she was treated with hydrogel  and a Profore Lite dressings.  She is not currently on any antibiotics.   PHYSICAL EXAMINATION:  VITAL SIGNS:  Temperature to be in 98, pulse 85,  respirations 20, blood pressure 142/91, and a capillary blood glucose is  115.  GENERAL:  The periwound skin shows significant hemosiderosis, but no  edema or significant exudate.  The wound edges are clean.  The wound  base is granulating well.  There is a slight amount of hypertrophic  granulation which I will continue to watch rather than to cauterize at  this point.   ASSESSMENT:  Continued improvement of venous leg ulcer in this 52-year-  old diabetic patient.  We will continue her on hydrogel and Profore Lite  wraps and we will see her back in 1 week.      Leonie Man, M.D.  Electronically Signed     PB/MEDQ  D:  06/29/2008  T:  06/30/2008  Job:  [id number]

## 2010-06-28 NOTE — Assessment & Plan Note (Signed)
[facility]   NAME:  Katie West, Katie West               ACCOUNT NO.:  [id number]   MEDICAL RECORD NO.:  [id number]      DATE OF BIRTH:  28-Jun-1955   PHYSICIAN:  Leonie Man, M.D.    VISIT DATE:  08/10/2008                                   OFFICE VISIT   PROBLEM:  Venous stasis disease of the left lower extremity.  The  patient is being treated with compression with Profore.  At her last  visit here, she was switched to a compression hose; however, she notes  that she has not been wearing the compression hose although her wound  dressings have continued with hydrogel and Promogran.  The patient is  aware of this miscommunication and promises to start wearing her  compression hose as I prescribed.   EXAM  Current wound measurements are 1.2 x 0.8 x 0.1.  The patient is  afebrile.  Temperature 98.5, pulse 85, respirations 18, blood pressure  121/81.  The base of the wound is clean and granulating.  There is some  mild amount of hypertrophic granulation arising above the level of the  skin surface.   Tx/  I do not think this is significant enough to require debridement today.  I will go ahead and treat her with hydrogel with Promogran and wrap her  in a Kerlix wrap followed by Coban.   DISP/  She is to change her dressing in 3 days with hydrogel, Coban, and resume  placing her compression stockings on her leg.  I will plan followup in 1  week.      Leonie Man, M.D.  Electronically Signed     PB/MEDQ  D:  08/10/2008  T:  08/10/2008  Job:  [id number]

## 2010-06-28 NOTE — Assessment & Plan Note (Signed)
[facility]   NAME:  Katie West, Katie West               ACCOUNT NO.:  [id number]   MEDICAL RECORD NO.:  [id number]      DATE OF BIRTH:  1956/01/06   PHYSICIAN:  Maxwell Caul, M.D. VISIT DATE:  06/04/2008                                   OFFICE VISIT   Katie West is a 55 year old woman we have been following for a venous  stasis ulcer on her left lower extremity.  She has been receiving  prisma, hydrogel, Adaptic under a Profore wrap.  Last week I changed  this to a pure hydrogel as the wound appeared to be closing over.  Unfortunately, the dimensions today have increased measuring 1.3 x 1 x  0.1.  It looked as though there may have been some irritation of the  area as there was a small excoriation lateral to the wound and she had  some pain last night.  I wonder whether this could be wrap injury.   IMPRESSION:  Venous stasis ulceration.  I put her back in a collagen  hydrogel, Adaptic under a Profore.  I think that the deterioration over  the course of this week was probably wrap injury.  We will see her again  next week.           ______________________________  Maxwell Caul, M.D.     MGR/MEDQ  D:  06/04/2008  T:  06/05/2008  Job:  [id number]

## 2010-06-28 NOTE — Assessment & Plan Note (Signed)
[facility]   NAME:  Katie West, Katie West               ACCOUNT NO.:  [id number]   MEDICAL RECORD NO.:  [id number]      DATE OF BIRTH:  07/16/1955   PHYSICIAN:  Joanne Gavel, M.D.         VISIT DATE:  09/02/2008                                   OFFICE VISIT   HISTORY OF PRESENT ILLNESS:  This is a 55 year old female with stasis  ulceration on distal pretibial area on the left.  This has been treated  with silver nitrate cautery of hypergranulation tissue and with  Silverlon and Allevyn patch and pressure stockings.   Today, physical examination reveals temperature 98, pulse 100,  respirations 16, blood pressure 130/78.  The ulcer is 1.7 x 0.8 x 0.2.  There is no appreciable slough.  The wound is bright red, clean.   PLAN/TREATMENT:  Promogran, hydrogel, Allevyn, and stockings.  Change  dressing 3 times per week, and see in 7 days.      Joanne Gavel, M.D.  Electronically Signed     RA/MEDQ  D:  09/02/2008  T:  09/03/2008  Job:  [id number]

## 2010-06-28 NOTE — Assessment & Plan Note (Signed)
[facility]   NAME:  Katie West, Katie West               ACCOUNT NO.:  [id number]   MEDICAL RECORD NO.:  [id number]      DATE OF BIRTH:  [birth year]   PHYSICIAN:  Lenon Curt. Chilton Si, M.D.   VISIT DATE:  03/20/2008                                   OFFICE VISIT   HISTORY:  A 55 year old female returns for recheck of ulcerations of the  left leg which are felt related to trauma and varicosities with  ulcerations.  The patient was seen for her initial visit on March 13, 2008.  She has done well over the last week with the treatment that we  elected which included Prisma, Adaptic gauze, and then Profore to left  leg.  She tolerated the dressings well and has no complaints.   Wound measurements are basically the same as when they were checked last  week.  Wound surfaces appear beefy red with good granulation tissue, and  there is no skin growth from the edges of the wound.  The patient is  feeling well, otherwise.  His vital signs are stable with temp 98.1,  pulse 93, respirations 18, blood pressure 112/79, and last capillary  glucose 82 mg%.   TREATMENT:  We elected to apply the same treatments with Prisma, Adaptic  gauze, and Profore to the left leg.  The patient was started on  doxycycline for a 10-day treatment beginning March 17, 2008, when a  wound culture done on March 16, 2008, returned showing methicillin-  resistant staph aureus.  The patient was advised to finish the full  treatment of this.  She will return for nurse recheck and dressing  changes in 1 week and for physician check in 2 weeks.      Lenon Curt Chilton Si, M.D.  Electronically Signed     AGG/MEDQ  D:  03/20/2008  T:  03/20/2008  Job:  [id number]

## 2010-06-28 NOTE — Assessment & Plan Note (Signed)
[facility]   NAME:  Katie West, Katie West               ACCOUNT NO.:  [id number]   MEDICAL RECORD NO.:  [id number]      DATE OF BIRTH:  October 23, 1955   PHYSICIAN:  Leonie Man, M.D.    VISIT DATE:  07/14/2008                                   OFFICE VISIT   PROBLEM:  Venous leg ulcer,  left lower extremity,  wound dimensions 0.9 x 0.2 x 0.1. cm   EXAM  [name]'s wound is healing quite well with significant decreases in  the wound size over the past week on hydrogel and Profore Lite dressing.  Today, she is afebrile.  Temperature 98.3, pulse 94, respirations 20,  and blood pressure 152/92.  Capillary blood glucose is 121.   TREATMENT:  Hydrogel, Adaptic, Profore Lite.   DISP  To return in 1 week.      Leonie Man, M.D.  Electronically Signed     PB/MEDQ  D:  07/14/2008  T:  07/15/2008  Job:  [id number]

## 2010-06-28 NOTE — Assessment & Plan Note (Signed)
[facility]   NAME:  Katie West, Katie West               ACCOUNT NO.:  [id number]   MEDICAL RECORD NO.:  [id number]      DATE OF BIRTH:  [birth year]   PHYSICIAN:  Leonie Man, M.D.    VISIT DATE:  07/20/2008                                   OFFICE VISIT   PROBLEM:  Left lower extremity venous stasis ulcer with venous stasis  disease.  Ulcer dimensions today are 1.5 x 0.7 x 0.1 cm, which is  minimally larger than it was on last evaluation.  The patient knows that  the edema in her leg was significantly decreased with her Profore  dressing and the wrap started to shift downward.  I suspect she may have  had some sort of sheer within the wrap that caused this slight  enlargement in the ulcer.   She has also been having some increased pain in the region of the ulcer.   On examination, she is afebrile.  Temperature 98.1, pulse 89,  respirations 18, blood pressure 128/70.  Capillary blood glucose is 210.  Ulcer base is clean with pink granulations, no slough, no odor, and no  drainage.  The wound appears appropriately moist.   The patient notes that she has been doing significantly more walking  within the past week and she plans to continue with this at the advice  of her nutritionist.   Plan for today is to place her in an [place] boot.  We will place an  Adaptic and a silver alginate dressing over the wound and follow up with  her in 1 week.      Leonie Man, M.D.  Electronically Signed     PB/MEDQ  D:  07/20/2008  T:  07/21/2008  Job:  [id number]
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
## 2010-06-28 NOTE — Assessment & Plan Note (Signed)
[facility]   NAME:  Katie West, Katie West               ACCOUNT NO.:  [id number]   MEDICAL RECORD NO.:  [id number]      DATE OF BIRTH:  October 19, 1955   PHYSICIAN:  Leonie Man, M.D.         VISIT DATE:                                   OFFICE VISIT   PROBLEM:  Venous stasis disease with venous stasis ulcer, left lateral  leg.  This ulcer extends from approximately the knee down to toward the  ankle on the left lateral side.  She is being treated with Profore  compression under which Prisma is being used.   On examination, she is afebrile with a temperature of 98.3, pulse 84,  respirations 18, blood pressure is 126/83.   Her glucose today is 121.   On examination, the wound is now very large but friable area of tissue  over the anterior lateral aspect of the leg which because of the very  thin epithelialization I am somewhat reluctant to put to remove her from  a wrap at this time and to put her in stockings which may cause some  shearing effect as she pulls the stocking up.  I will go ahead and treat  her with Prisma and a Profore wrap today.  We will follow up with her in  10 days to see if the epithelial covering is more stable.      Leonie Man, M.D.  Electronically Signed     PB/MEDQ  D:  05/18/2008  T:  05/19/2008  Job:  [id number]

## 2010-06-28 NOTE — Assessment & Plan Note (Signed)
[facility]   NAME:  RINI, MOFFIT               ACCOUNT NO.:  [id number]   MEDICAL RECORD NO.:  [id number]      DATE OF BIRTH:  25-May-1955   PHYSICIAN:  Jonelle Sports. Sevier, M.D.  VISIT DATE:  08/26/2008                                   OFFICE VISIT   HISTORY:  This 55 year old black female has been followed for a stasis  ulceration on her distal pretibial area on the left.   When she was seen a week ago by Dr. Wiliam Ke, he thought this was slightly  hypergranular and accordingly treated with silver nitrate cautery.  She  was then dressed in hydrogel, Kerlix, and Coban.  She has since with his  permission changed over to use just her compression stockings.  She  arrives today thinking that her wound is indeed continuing to improve.   PHYSICAL EXAMINATION:  Blood pressure is 128/82, pulse 98, respirations  18, and temperature 99.  The wound itself now measures 1.4 x 0.7 x 0.1  cm which is indeed smaller than a week ago, significantly so.  Again,  there is a slight hypergranulation and a slight protrusion of the wound  base beyond its healing skin margins.   IMPRESSION:  Satisfactory course of stasis ulceration of left lower  extremity with some persistent hypergranulation, however.   DISPOSITION:  Again today, the wound is debrided of this  hypergranulation using silver nitrate cautery and it is then dressed  with an application of Silverlon patch covered with an Allevyn pad and  she is maintained in her compression hose.   Followup visit will be here in 1 week.           ______________________________  Jonelle Sports. Cheryll Cockayne, M.D.     RES/MEDQ  D:  08/26/2008  T:  08/27/2008  Job:  [id number]

## 2010-06-28 NOTE — Assessment & Plan Note (Signed)
[facility]   NAME:  Katie West, Katie West               ACCOUNT NO.:  [id number]   MEDICAL RECORD NO.:  [id number]      DATE OF BIRTH:  Sep 05, 1955   PHYSICIAN:  Ardath Sax, M.D.           VISIT DATE:                                   OFFICE VISIT   The ulcer on her anterior left leg is totally epithelized, so she is  discharged from the Clinic.      Ardath Sax, M.D.     PP/MEDQ  D:  09/23/2008  T:  09/24/2008  Job:  [id number]

## 2010-06-28 NOTE — Assessment & Plan Note (Signed)
[facility]   NAME:  Katie West, Katie West               ACCOUNT NO.:  [id number]   MEDICAL RECORD NO.:  [id number]      DATE OF BIRTH:  1956-01-18   PHYSICIAN:  Maxwell Caul, M.D. VISIT DATE:  05/07/2008                                   OFFICE VISIT   [name] is a lady we have been following for venous stasis disease  in her left lower extremity.  Original wounds were also partially  traumatic in etiology.  She has been receiving Prisma, Adaptic gauze and  a Profore.  She tells me she has venous stasis in the left leg for  several years and other than itching, she is not complaining of any pain  or drainage.   On examination, her temperature is 98.6.  There is a significant area of  venous stasis over the left anterior leg.  The wound which is the major  wound anteriorly was apparently one larger wound at one point.  It is  broken into two with an [place] of normal skin.  There were two smaller  areas superiorly.  All of these look clean and are on their way to  resolving.   IMPRESSION:  Venous stasis disease.  We have applied TCA to normal skin.  We will use Prisma, Adaptic gauze and Profores.  I forecast these will  probably close over in the next week or two at which time we will need  to decide about ongoing prophylaxis.           ______________________________  Maxwell Caul, M.D.     MGR/MEDQ  D:  05/07/2008  T:  05/07/2008  Job:  [id number]

## 2010-06-28 NOTE — Assessment & Plan Note (Signed)
[facility]   NAME:  Katie West, Katie West               ACCOUNT NO.:  [id number]   MEDICAL RECORD NO.:  [id number]      DATE OF BIRTH:  02-13-1956   PHYSICIAN:  Leonie Man, M.D.    VISIT DATE:  04/03/2008                                   OFFICE VISIT   PROBLEM:  Venostasis disease with ulcers of the left lower extremity.   HISTORY:  The patient is a 55 year old female diabetic returning for  evaluation of her ulcers of her left leg the largest of which is on the  anterior left leg.  This has been the primary problem for her.  She has  small ulcers lower down on the left leg.   The wound measurements were somewhat smaller when they were checked last  week.  The wound on the anterior left leg is not completely granulated,  with no exudate.  As a matter of fact, there is some degree of  hypergranulation and some ingrowth of epithelial cells.  There is  minimal drainage and no odor.   The patient's vital signs are within normal limits.  She has had a  history of methicillin-resistant Staphylococcus aureus in the past.  This has been adequately treated with a 10-day course of doxycycline 100  mg b.i.d.   Treatment today, I selectively debrided an eschar from the legs, and we  will go ahead and put her in calcium alginate dressing and a Profore  wrap.  I will see her again in 1 week.      Leonie Man, M.D.  Electronically Signed     PB/MEDQ  D:  04/03/2008  T:  04/04/2008  Job:  [id number]

## 2010-06-28 NOTE — Assessment & Plan Note (Signed)
[facility]   NAME:  Katie West, Katie West               ACCOUNT NO.:  [id number]   MEDICAL RECORD NO.:  [id number]      DATE OF BIRTH:  05-06-1955   PHYSICIAN:  Leonie Man, M.D.    VISIT DATE:  06/16/2008                                   OFFICE VISIT   PROBLEM:  Venous stasis disease with venous stasis ulcer on the left leg  of this 55 year old female.  Her last visit was on June 09, 2008.  At  that time, she was treated with collagen, hydrogel, and Profore wraps.  The patient is here today for followup.   On examination, the patient is afebrile with a temperature of 98.3,  pulse 92, respirations 20, blood pressure 140/76.  Current capillary  blood glucose is 155.   Current wounds measurements are one of the pretibial left lower  extremities 1.3 x 1.5 x 0.1, which is essentially unchanged in  dimensions from the last time she was seen here; however, the ulcer is  granulating well and showing significant amounts of epithelialization.   The pair wound integrity is intact.  There is no odor or drainage.   Treatment today for this venous leg also will consist of a Profore wrap  to the left lower extremity, which will be changed on a weekly basis.  The patient does have a small blister on the third toe of her left foot,  which we covered with a Band-Aid and asked her to protect this with  after daily showers.  We will have to keep an eye on this because of  this woman's history of diabetes, although she does not have any  evidence of ongoing neuropathy at this time.   Follow up in 2 weeks.      Leonie Man, M.D.  Electronically Signed     PB/MEDQ  D:  06/16/2008  T:  06/17/2008  Job:  [id number]

## 2010-06-28 NOTE — Assessment & Plan Note (Signed)
[facility]   NAME:  JERRA, HUCKEBY               ACCOUNT NO.:  [id number]   MEDICAL RECORD NO.:  [id number]      DATE OF BIRTH:  04-21-1955   PHYSICIAN:  Joanne Gavel, M.D.         VISIT DATE:  07/27/2008                                   OFFICE VISIT   HISTORY OF PRESENT ILLNESS:  This is a 55 year old female with a left  lower extremity venous stasis ulcer, treated last week with silver  alginate and Unna boot.   PHYSICAL EXAMINATION:  The patient is afebrile.  Pulse and blood  pressure stable.  The wound is 1.5 x 0.7 x 0.1.  It is bright red with  nice granulation tissue.  Surrounding skin appears healthy.   PLAN:  Silver alginate, Adaptic, and Unna boot.  See in 7 days.      Joanne Gavel, M.D.  Electronically Signed     RA/MEDQ  D:  07/27/2008  T:  07/27/2008  Job:  [id number]

## 2010-06-28 NOTE — Assessment & Plan Note (Signed)
[facility]   NAME:  Katie West, Katie West               ACCOUNT NO.:  [id number]   MEDICAL RECORD NO.:  [id number]      DATE OF BIRTH:  [birth year]   PHYSICIAN:  Leonie Man, M.D.    VISIT DATE:  06/22/2008                                   OFFICE VISIT   PROBLEMS:  1. Venous leg ulcer, left lower extremity, located in the pretibial      region.  Current treatment:  Hydrogel and Profore, changed once weekly.  1. Blister, third toe.  Current treatment:  With dry dressings.   HISTORY:  The patient is a 54 year old female with venous stasis disease  and venous leg ulcer.   She feels well without any specific complaints.   On examination, she is afebrile, temperature 97.2, pulse 75,  respirations 20, blood pressure 122/82.  The anterior leg venous ulcer  measures 1.0 x 1.0 x 0.1.  There is no periulcer skin reaction.  The  ulcer edges are clean, and the ulcer base shows excellent granulation.  There may be a very slight amount of hypergranulation.   TREATMENT:  Hydrogel and Profore.   The blister on the third toe of the left lower extremity has now  ruptured.  I went ahead and debrided the blister off.  I will treat this  with triple antibiotic ointment and a Band-Aid.  We will follow up with  the patient in 1 week.      Leonie Man, M.D.  Electronically Signed     PB/MEDQ  D:  06/22/2008  T:  06/23/2008  Job:  [id number]

## 2010-09-05 ENCOUNTER — Ambulatory Visit: Payer: Medicare Other | Attending: Family Medicine | Admitting: Physical Therapy

## 2010-09-05 DIAGNOSIS — M545 Low back pain, unspecified: Secondary | ICD-10-CM | POA: Insufficient documentation

## 2010-09-05 DIAGNOSIS — M25569 Pain in unspecified knee: Secondary | ICD-10-CM | POA: Insufficient documentation

## 2010-09-05 DIAGNOSIS — IMO0001 Reserved for inherently not codable concepts without codable children: Secondary | ICD-10-CM | POA: Insufficient documentation

## 2010-09-05 DIAGNOSIS — R293 Abnormal posture: Secondary | ICD-10-CM | POA: Insufficient documentation

## 2010-09-06 ENCOUNTER — Ambulatory Visit: Payer: Medicare Other | Admitting: Physical Therapy

## 2010-09-14 ENCOUNTER — Ambulatory Visit: Payer: Medicare Other | Attending: Family Medicine | Admitting: Physical Therapy

## 2010-09-14 DIAGNOSIS — M545 Low back pain, unspecified: Secondary | ICD-10-CM | POA: Insufficient documentation

## 2010-09-14 DIAGNOSIS — R293 Abnormal posture: Secondary | ICD-10-CM | POA: Insufficient documentation

## 2010-09-14 DIAGNOSIS — IMO0001 Reserved for inherently not codable concepts without codable children: Secondary | ICD-10-CM | POA: Insufficient documentation

## 2010-09-14 DIAGNOSIS — M25569 Pain in unspecified knee: Secondary | ICD-10-CM | POA: Insufficient documentation

## 2010-09-16 ENCOUNTER — Ambulatory Visit: Payer: Medicare Other | Admitting: Physical Therapy

## 2010-09-19 ENCOUNTER — Ambulatory Visit: Payer: Medicare Other | Admitting: Physical Therapy

## 2010-09-21 ENCOUNTER — Ambulatory Visit: Payer: Medicare Other | Admitting: Physical Therapy

## 2010-09-28 ENCOUNTER — Encounter: Payer: Medicare Other | Admitting: Physical Therapy

## 2010-09-30 ENCOUNTER — Encounter: Payer: Medicare Other | Admitting: Physical Therapy

## 2010-11-07 LAB — WOUND CULTURE

## 2010-11-11 LAB — DIFFERENTIAL
Lymphocytes Relative: 12
Lymphs Abs: 1.1
Monocytes Relative: 8
Neutro Abs: 8 — ABNORMAL HIGH
Neutrophils Relative %: 81 — ABNORMAL HIGH

## 2010-11-11 LAB — POCT I-STAT, CHEM 8
BUN: 23
Chloride: 111
Creatinine, Ser: 1.2
Glucose, Bld: 202 — ABNORMAL HIGH
Potassium: 3.8

## 2010-11-11 LAB — GLUCOSE, CAPILLARY: Glucose-Capillary: 198 — ABNORMAL HIGH

## 2010-11-11 LAB — CBC
MCV: 83.6
RBC: 4.17
WBC: 9.9

## 2010-12-07 ENCOUNTER — Inpatient Hospital Stay (INDEPENDENT_AMBULATORY_CARE_PROVIDER_SITE_OTHER)
Admission: RE | Admit: 2010-12-07 | Discharge: 2010-12-07 | Disposition: A | Discharge status: Home / Self Care | Payer: Medicare Other | Source: Ambulatory Visit | Attending: Emergency Medicine | Admitting: Emergency Medicine

## 2010-12-07 DIAGNOSIS — N760 Acute vaginitis: Secondary | ICD-10-CM

## 2010-12-07 LAB — WET PREP, GENITAL
Clue Cells Wet Prep HPF POC: NONE SEEN
Yeast Wet Prep HPF POC: NONE SEEN

## 2010-12-07 LAB — POCT URINALYSIS DIP (DEVICE)
Ketones, ur: NEGATIVE mg/dL
Protein, ur: NEGATIVE mg/dL
Specific Gravity, Urine: >=1.03 (ref 1.005–1.030)
pH: 5.5 (ref 5.0–8.0)

## 2010-12-08 LAB — GC/CHLAMYDIA PROBE AMP, GENITAL: GC Probe Amp, Genital: NEGATIVE

## 2011-04-17 ENCOUNTER — Other Ambulatory Visit (HOSPITAL_COMMUNITY): Payer: Self-pay | Admitting: Family Medicine

## 2011-04-17 DIAGNOSIS — Z1231 Encounter for screening mammogram for malignant neoplasm of breast: Secondary | ICD-10-CM

## 2011-05-11 ENCOUNTER — Ambulatory Visit (HOSPITAL_COMMUNITY): Payer: Medicare Other

## 2011-05-17 ENCOUNTER — Ambulatory Visit (HOSPITAL_COMMUNITY)
Admission: RE | Admit: 2011-05-17 | Discharge: 2011-05-17 | Disposition: A | Discharge status: Home / Self Care | Payer: Medicare Other | Source: Ambulatory Visit | Attending: Family Medicine | Admitting: Family Medicine

## 2011-05-17 DIAGNOSIS — Z1231 Encounter for screening mammogram for malignant neoplasm of breast: Secondary | ICD-10-CM

## 2011-05-23 ENCOUNTER — Emergency Department (INDEPENDENT_AMBULATORY_CARE_PROVIDER_SITE_OTHER)
Admission: EM | Admit: 2011-05-23 | Discharge: 2011-05-23 | Disposition: A | Discharge status: Home / Self Care | Payer: Medicare Other | Source: Home / Self Care | Attending: Emergency Medicine | Admitting: Emergency Medicine

## 2011-05-23 ENCOUNTER — Encounter (HOSPITAL_COMMUNITY): Payer: Self-pay | Admitting: Cardiology

## 2011-05-23 DIAGNOSIS — R6889 Other general symptoms and signs: Secondary | ICD-10-CM

## 2011-05-23 DIAGNOSIS — J111 Influenza due to unidentified influenza virus with other respiratory manifestations: Secondary | ICD-10-CM

## 2011-05-23 HISTORY — DX: Essential (primary) hypertension: I10

## 2011-05-23 HISTORY — DX: Unilateral primary osteoarthritis, unspecified knee: M17.10

## 2011-05-23 HISTORY — DX: Pure hypercholesterolemia, unspecified: E78.00

## 2011-05-23 LAB — POCT URINALYSIS DIP (DEVICE)
Protein, ur: 30 mg/dL — AB
Specific Gravity, Urine: >=1.03 (ref 1.005–1.030)
Urobilinogen, UA: 0.2 mg/dL (ref 0.0–1.0)
pH: 5 (ref 5.0–8.0)

## 2011-05-23 LAB — GLUCOSE, CAPILLARY: Glucose-Capillary: 168 mg/dL — ABNORMAL HIGH (ref 70–99)

## 2011-05-23 LAB — POCT RAPID STREP A: Streptococcus, Group A Screen (Direct): NEGATIVE

## 2011-05-23 MED ORDER — HYDROCODONE-ACETAMINOPHEN 5-325 MG PO TABS: 1.0000 | ORAL_TABLET | Freq: Four times a day (QID) | ORAL | Status: DC | PRN

## 2011-05-23 MED ORDER — DEXAMETHASONE 4 MG PO TABS: ORAL_TABLET | ORAL | Status: AC

## 2011-05-23 MED ORDER — LIDOCAINE VISCOUS 2 % MT SOLN: 10.0000 mL | Freq: Three times a day (TID) | OROMUCOSAL | Status: AC | PRN

## 2011-05-23 MED ORDER — ALBUTEROL SULFATE HFA 108 (90 BASE) MCG/ACT IN AERS: 1.0000 | INHALATION_SPRAY | Freq: Four times a day (QID) | RESPIRATORY_TRACT | Status: DC | PRN

## 2011-05-23 MED ORDER — GUAIFENESIN ER 600 MG PO TB12: 1200.0000 mg | ORAL_TABLET | Freq: Two times a day (BID) | ORAL | Status: DC

## 2011-05-23 NOTE — ED Provider Notes (Signed)
History     CSN: [id number]  Arrival date & time 05/23/11  1643   First MD Initiated Contact with Patient 05/23/11 1650      Chief Complaint  Patient presents with  . Nasal Congestion  . Headache  . Cough  . Sore Throat  . Generalized Body Aches    (Consider location/radiation/quality/duration/timing/severity/associated sxs/prior treatment) HPI Comments: Patient with rhinorrhea, sore throat, diffuse headaches, myalgias, fatigue for 3 days. Reports coughing, chest tightness. She she's unable to sleep at night secondary to all of coughing. No wheezing, shortness of breath. No frontal sinus pain/pressure, purulent nasal discharge. Reports decreased appetite, but is tolerating by mouth. Reports mild, diffuse headaches, particularly with coughing. States that her upper abdomen and chest is "sore" from coughing. Is taking ibuprofen, last dose this morning for bodyaches, with mild, temporary relief. Is also taking some Vicodin Rx to her for her knee arthritis for her sore throat, with improvement. Patient states that her son had identical symptoms last week. She also reports urinary frequency, but no urgency, dysuria, cloudy or oderous urine. No hematuria. No abdominal pain. States that she has not been checking her glucose while she's been sick, but has been taking her diabetic medications.  ROS as noted in HPI. All other ROS negative.   Patient is a 56 y.o. female presenting with headaches, cough, and pharyngitis. The history is provided by the patient.  Headache The primary symptoms include headaches and nausea.  Cough Associated symptoms include headaches.  Sore Throat Associated symptoms include headaches.    Past Medical History  Diagnosis Date  . Diabetes mellitus   . Hypertension   . Hypercholesterolemia   . Arthritis of knee     Past Surgical History  Procedure Date  . Tubaligation 1981  . Ankle arthroscopy 2007    right    History reviewed. No pertinent family  history.  History  Substance Use Topics  . Smoking status: Former Smoker    Quit date: 02/14/1996  . Smokeless tobacco: Not on file  . Alcohol Use: No    OB History    Grav Para Term Preterm Abortions TAB SAB Ect Mult Living                  Review of Systems  Respiratory: Positive for cough.   Gastrointestinal: Positive for nausea.  Neurological: Positive for headaches.    Allergies  Latex  Home Medications   Current Outpatient Rx  Name Route Sig Dispense Refill  . GLIMEPIRIDE 4 MG PO TABS Oral Take 4 mg by mouth 2 (two) times daily.    Marland Kitchen LANTUS SOLOSTAR [place] Subcutaneous Inject 50 Units into the skin at bedtime.    Marland Kitchen LISINOPRIL 10 MG PO TABS Oral Take 10 mg by mouth daily.    . MELOXICAM 7.5 MG PO TABS Oral Take 7.5 mg by mouth 2 (two) times daily.    Marland Kitchen METFORMIN HCL 500 MG PO TABS Oral Take 500 mg by mouth 2 (two) times daily with a meal.    . ACTOS PO Oral Take 30 mg by mouth daily.    Marland Kitchen PRESCRIPTION MEDICATION  "water pill" unsure of name or dose    . CRESTOR PO Oral Take by mouth. Unsure of dose    . ALBUTEROL SULFATE HFA 108 (90 BASE) MCG/ACT IN AERS Inhalation Inhale 1-2 puffs into the lungs every 6 (six) hours as needed for wheezing. 1 Inhaler 0  . DEXAMETHASONE 4 MG PO TABS  4 tabs po at  once on day one, 4 tabs po at once on day 2 8 tablet 0  . GUAIFENESIN ER 600 MG PO TB12 Oral Take 2 tablets (1,200 mg total) by mouth 2 (two) times daily. 28 tablet 0  . HYDROCODONE-ACETAMINOPHEN 5-325 MG PO TABS Oral Take 1-2 tablets by mouth every 6 (six) hours as needed for pain (cough). 20 tablet 0  . LIDOCAINE VISCOUS 2 % MT SOLN Oral Take 10 mLs by mouth 3 (three) times daily as needed for pain. Swish and spit. Do not swallow. 100 mL 0    BP 123/75  Pulse 98  Temp(Src) 101 F (38.3 C) (Oral)  Resp 18  SpO2 98%  Physical Exam  Nursing note and vitals reviewed. Constitutional: She is oriented to person, place, and time. She appears well-developed and well-nourished.    HENT:  Head: Normocephalic and atraumatic.  Right Ear: Tympanic membrane and ear canal normal.  Left Ear: Tympanic membrane and ear canal normal.  Nose: Mucosal edema and rhinorrhea present. No epistaxis.  Mouth/Throat: Uvula is midline and mucous membranes are normal. Posterior oropharyngeal erythema present. No oropharyngeal exudate.       (-) frontal, maxillary sinus tenderness  Eyes: Conjunctivae and EOM are normal. Pupils are equal, round, and reactive to light.  Fundoscopic exam:      The right eye shows no hemorrhage and no papilledema.       The left eye shows no hemorrhage and no papilledema.  Neck: Normal range of motion. Neck supple.  Cardiovascular: Normal rate, regular rhythm and normal heart sounds.   Pulmonary/Chest: Effort normal and breath sounds normal. She exhibits tenderness.  Abdominal: Soft. She exhibits no distension and no mass. There is no rebound and no guarding.       mild tenderness along the upper abdomen. No suprapubic or CVA tenderness  Musculoskeletal: Normal range of motion.  Lymphadenopathy:    She has no cervical adenopathy.  Neurological: She is alert and oriented to person, place, and time. No cranial nerve deficit or sensory deficit. Gait normal.  Skin: Skin is warm and dry. No rash noted.  Psychiatric: She has a normal mood and affect. Her behavior is normal. Judgment and thought content normal.    ED Course  Procedures (including critical care time)  Labs Reviewed  GLUCOSE, CAPILLARY - Abnormal; Notable for the following:    Glucose-Capillary 168 (*)    All other components within normal limits  POCT URINALYSIS DIP (DEVICE) - Abnormal; Notable for the following:    Bilirubin Urine SMALL (*)    Ketones, ur 15 (*)    Protein, ur 30 (*)    All other components within normal limits  POCT RAPID STREP A ([facility] ONLY)  LAB REPORT - SCANNED   No results found.   1. Flu-like symptoms     Results for orders placed during the hospital  encounter of 05/23/11  POCT RAPID STREP A ([facility] ONLY)      Component Value Range   Streptococcus, Group A Screen (Direct) NEGATIVE  NEGATIVE   GLUCOSE, CAPILLARY      Component Value Range   Glucose-Capillary 168 (*) 70 - 99 (mg/dL)  POCT URINALYSIS DIP (DEVICE)      Component Value Range   Glucose, UA NEGATIVE  NEGATIVE (mg/dL)   Bilirubin Urine SMALL (*) NEGATIVE    Ketones, ur 15 (*) NEGATIVE (mg/dL)   Specific Gravity, Urine >=1.030  1.005 - 1.030    Hgb urine dipstick NEGATIVE  NEGATIVE    pH 5.0  5.0 - 8.0    Protein, ur 30 (*) NEGATIVE (mg/dL)   Urobilinogen, UA 0.2  0.0 - 1.0 (mg/dL)   Nitrite NEGATIVE  NEGATIVE    Leukocytes, UA NEGATIVE  NEGATIVE     Results for orders placed during the hospital encounter of 05/23/11  POCT RAPID STREP A ([facility] ONLY)      Component Value Range   Streptococcus, Group A Screen (Direct) NEGATIVE  NEGATIVE   GLUCOSE, CAPILLARY      Component Value Range   Glucose-Capillary 168 (*) 70 - 99 (mg/dL)  POCT URINALYSIS DIP (DEVICE)      Component Value Range   Glucose, UA NEGATIVE  NEGATIVE (mg/dL)   Bilirubin Urine SMALL (*) NEGATIVE    Ketones, ur 15 (*) NEGATIVE (mg/dL)   Specific Gravity, Urine >=1.030  1.005 - 1.030    Hgb urine dipstick NEGATIVE  NEGATIVE    pH 5.0  5.0 - 8.0    Protein, ur 30 (*) NEGATIVE (mg/dL)   Urobilinogen, UA 0.2  0.0 - 1.0 (mg/dL)   Nitrite NEGATIVE  NEGATIVE    Leukocytes, UA NEGATIVE  NEGATIVE     MDM  Checking CBG, as patient is reporting urinary urgency and frequency despite decreased fluid intake, and has not been checking her glucose while she's been sick. Also checking UA to rule out UTI. Lungs are clear, satting 98% room-air doubt pneumonia. Patient afebrile, vitals acceptable, with erythematous pharynx, enlarged, red tonsils, shotty lymphadenopathy, will check rapid strep. If these are negative, we'll treat this as if this is a influenza-like illness with bronchitis. No evidence of otitis,  meningitis, sinusitis, intra-abdominal process causing her symptoms. Discussed MDM, plan with patient and family, who agree./  Luiz Blare, MD 05/25/11 [phone]

## 2011-05-23 NOTE — Discharge Instructions (Signed)
Take 2 puffs from your albuterol inhaler every 4-6 hours as needed for cough, chest tightness. Finish the steroids, unless your Dr. drowsiness.. This will make her blood sugar elevated for several days. Take the Norco for sore throat, and will also help your decrease her cough as well. Continue the Mobic, this is very good for musculoskeletal pain and soreness. Return if you've a fever above 100.4, if you get worse, or for any other concerns

## 2011-05-23 NOTE — ED Notes (Addendum)
Pt reports onset of symptoms since this past Sunday of headache that is different than any headache she has had before. Decreased appetite due to sore throat. Nasal congestion that has now turned into a runny nose drainage is clear. Pt reports feeling hot yesterday and noted to have temp here at [facility] tonight. The pt had ibuprofen last dose being this morning for generalized aches and pains. Pt has not slept since Sunday due to cough, headache and body aches. Pt denies headache during triage.

## 2011-06-15 ENCOUNTER — Encounter (INDEPENDENT_AMBULATORY_CARE_PROVIDER_SITE_OTHER): Payer: Medicare Other | Admitting: Ophthalmology

## 2011-06-15 DIAGNOSIS — E11319 Type 2 diabetes mellitus with unspecified diabetic retinopathy without macular edema: Secondary | ICD-10-CM

## 2011-06-15 DIAGNOSIS — I10 Essential (primary) hypertension: Secondary | ICD-10-CM

## 2011-06-15 DIAGNOSIS — H43819 Vitreous degeneration, unspecified eye: Secondary | ICD-10-CM

## 2011-06-15 DIAGNOSIS — H35039 Hypertensive retinopathy, unspecified eye: Secondary | ICD-10-CM

## 2011-06-15 DIAGNOSIS — E1065 Type 1 diabetes mellitus with hyperglycemia: Secondary | ICD-10-CM

## 2011-06-15 DIAGNOSIS — H2510 Age-related nuclear cataract, unspecified eye: Secondary | ICD-10-CM

## 2011-07-31 ENCOUNTER — Observation Stay (HOSPITAL_COMMUNITY)
Admission: EM | Admit: 2011-07-31 | Discharge: 2011-08-03 | Disposition: A | Discharge status: Home / Self Care | Payer: Medicare Other | Attending: Internal Medicine | Admitting: Internal Medicine

## 2011-07-31 ENCOUNTER — Encounter (HOSPITAL_COMMUNITY): Payer: Self-pay | Admitting: Emergency Medicine

## 2011-07-31 DIAGNOSIS — Z23 Encounter for immunization: Secondary | ICD-10-CM | POA: Insufficient documentation

## 2011-07-31 DIAGNOSIS — N19 Unspecified kidney failure: Secondary | ICD-10-CM

## 2011-07-31 DIAGNOSIS — R5381 Other malaise: Secondary | ICD-10-CM | POA: Insufficient documentation

## 2011-07-31 DIAGNOSIS — I10 Essential (primary) hypertension: Secondary | ICD-10-CM

## 2011-07-31 DIAGNOSIS — D649 Anemia, unspecified: Secondary | ICD-10-CM

## 2011-07-31 DIAGNOSIS — E119 Type 2 diabetes mellitus without complications: Secondary | ICD-10-CM

## 2011-07-31 DIAGNOSIS — R739 Hyperglycemia, unspecified: Secondary | ICD-10-CM

## 2011-07-31 DIAGNOSIS — R5383 Other fatigue: Secondary | ICD-10-CM | POA: Insufficient documentation

## 2011-07-31 DIAGNOSIS — E1139 Type 2 diabetes mellitus with other diabetic ophthalmic complication: Secondary | ICD-10-CM | POA: Diagnosis present

## 2011-07-31 DIAGNOSIS — E860 Dehydration: Secondary | ICD-10-CM | POA: Insufficient documentation

## 2011-07-31 DIAGNOSIS — IMO0001 Reserved for inherently not codable concepts without codable children: Secondary | ICD-10-CM | POA: Insufficient documentation

## 2011-07-31 DIAGNOSIS — E785 Hyperlipidemia, unspecified: Secondary | ICD-10-CM | POA: Insufficient documentation

## 2011-07-31 DIAGNOSIS — N179 Acute kidney failure, unspecified: Principal | ICD-10-CM

## 2011-07-31 HISTORY — DX: Anemia, unspecified: D64.9

## 2011-07-31 LAB — BASIC METABOLIC PANEL
BUN: 49 mg/dL — ABNORMAL HIGH (ref 6–23)
CO2: 22 mEq/L (ref 19–32)
Chloride: 100 mEq/L (ref 96–112)
GFR calc Af Amer: 26 mL/min — ABNORMAL LOW (ref >90)
Sodium: 138 mEq/L (ref 135–145)

## 2011-07-31 LAB — DIFFERENTIAL
Basophils Relative: 0 % (ref 0–1)
Lymphocytes Relative: 38 % (ref 12–46)
Lymphs Abs: 2.8 10*3/uL (ref 0.7–4.0)
Monocytes Absolute: 0.8 10*3/uL (ref 0.1–1.0)
Monocytes Relative: 10 % (ref 3–12)
Neutro Abs: 3.7 10*3/uL (ref 1.7–7.7)

## 2011-07-31 LAB — GLUCOSE, CAPILLARY

## 2011-07-31 LAB — CBC
HCT: 37.1 % (ref 36.0–46.0)
Hemoglobin: 11.8 g/dL — ABNORMAL LOW (ref 12.0–15.0)
MCHC: 31.8 g/dL (ref 30.0–36.0)
RBC: 4.43 MIL/uL (ref 3.87–5.11)

## 2011-07-31 NOTE — ED Notes (Signed)
Pt reports she has run out of meds (HTN, diabetes)  for about a week

## 2011-07-31 NOTE — ED Notes (Signed)
CBG 330 

## 2011-08-01 ENCOUNTER — Inpatient Hospital Stay (HOSPITAL_COMMUNITY): Payer: Medicare Other

## 2011-08-01 ENCOUNTER — Encounter (HOSPITAL_COMMUNITY): Payer: Self-pay | Admitting: Internal Medicine

## 2011-08-01 DIAGNOSIS — I10 Essential (primary) hypertension: Secondary | ICD-10-CM

## 2011-08-01 DIAGNOSIS — E1165 Type 2 diabetes mellitus with hyperglycemia: Secondary | ICD-10-CM

## 2011-08-01 DIAGNOSIS — E1139 Type 2 diabetes mellitus with other diabetic ophthalmic complication: Secondary | ICD-10-CM | POA: Diagnosis present

## 2011-08-01 DIAGNOSIS — E782 Mixed hyperlipidemia: Secondary | ICD-10-CM

## 2011-08-01 DIAGNOSIS — N179 Acute kidney failure, unspecified: Secondary | ICD-10-CM

## 2011-08-01 DIAGNOSIS — E785 Hyperlipidemia, unspecified: Secondary | ICD-10-CM | POA: Diagnosis present

## 2011-08-01 LAB — URINALYSIS, ROUTINE W REFLEX MICROSCOPIC
Leukocytes, UA: NEGATIVE
Nitrite: NEGATIVE
Protein, ur: NEGATIVE mg/dL
Specific Gravity, Urine: 1.014 (ref 1.005–1.030)
Urobilinogen, UA: 0.2 mg/dL (ref 0.0–1.0)

## 2011-08-01 LAB — GLUCOSE, CAPILLARY
Glucose-Capillary: 246 mg/dL — ABNORMAL HIGH (ref 70–99)
Glucose-Capillary: 301 mg/dL — ABNORMAL HIGH (ref 70–99)
Glucose-Capillary: 321 mg/dL — ABNORMAL HIGH (ref 70–99)
Glucose-Capillary: 327 mg/dL — ABNORMAL HIGH (ref 70–99)
Glucose-Capillary: 349 mg/dL — ABNORMAL HIGH (ref 70–99)

## 2011-08-01 LAB — CBC
HCT: 33.1 % — ABNORMAL LOW (ref 36.0–46.0)
Hemoglobin: 10.6 g/dL — ABNORMAL LOW (ref 12.0–15.0)
MCV: 83 fL (ref 78.0–100.0)
RDW: 14.4 % (ref 11.5–15.5)
WBC: 5.6 10*3/uL (ref 4.0–10.5)

## 2011-08-01 LAB — PROTEIN / CREATININE RATIO, URINE
Protein Creatinine Ratio: 0.05 (ref 0.00–0.15)
Total Protein, Urine: 4.3 mg/dL

## 2011-08-01 LAB — COMPREHENSIVE METABOLIC PANEL
ALT: 11 U/L (ref 0–35)
Albumin: 3.1 g/dL — ABNORMAL LOW (ref 3.5–5.2)
Alkaline Phosphatase: 65 U/L (ref 39–117)
BUN: 46 mg/dL — ABNORMAL HIGH (ref 6–23)
Potassium: 3.8 mEq/L (ref 3.5–5.1)
Sodium: 139 mEq/L (ref 135–145)
Total Protein: 6.3 g/dL (ref 6.0–8.3)

## 2011-08-01 LAB — CARDIAC PANEL(CRET KIN+CKTOT+MB+TROPI)
CK, MB: 1.6 ng/mL (ref 0.3–4.0)
Relative Index: 1.5 (ref 0.0–2.5)

## 2011-08-01 LAB — MRSA PCR SCREENING: MRSA by PCR: NEGATIVE

## 2011-08-01 LAB — HEMOGLOBIN A1C: Mean Plasma Glucose: 272 mg/dL — ABNORMAL HIGH (ref <117)

## 2011-08-01 MED ORDER — PIOGLITAZONE HCL 30 MG PO TABS
30.0000 mg | ORAL_TABLET | Freq: Every day | ORAL | Status: DC
  Administered 2011-08-01 – 2011-08-03 (×3): 30 mg via ORAL
  Filled 2011-08-01 (×3): qty 1

## 2011-08-01 MED ORDER — SODIUM CHLORIDE 0.9 % IV SOLN
INTRAVENOUS | Status: AC
  Administered 2011-08-01: 2.6 [IU]/h via INTRAVENOUS
  Filled 2011-08-01: qty 1

## 2011-08-01 MED ORDER — GLIMEPIRIDE 4 MG PO TABS
4.0000 mg | ORAL_TABLET | Freq: Two times a day (BID) | ORAL | Status: DC
  Administered 2011-08-01 – 2011-08-02 (×3): 4 mg via ORAL
  Filled 2011-08-01 (×5): qty 1

## 2011-08-01 MED ORDER — SODIUM CHLORIDE 0.9 % IV BOLUS (SEPSIS)
1000.0000 mL | Freq: Once | INTRAVENOUS | Status: AC
  Administered 2011-08-01: 1000 mL via INTRAVENOUS

## 2011-08-01 MED ORDER — INSULIN GLARGINE 100 UNIT/ML ~~LOC~~ SOLN: 50.0000 [IU] | Freq: Every day | SUBCUTANEOUS | Status: DC

## 2011-08-01 MED ORDER — ONDANSETRON HCL 4 MG PO TABS
4.0000 mg | ORAL_TABLET | Freq: Four times a day (QID) | ORAL | Status: DC | PRN
  Administered 2011-08-03: 4 mg via ORAL
  Filled 2011-08-01: qty 1

## 2011-08-01 MED ORDER — HYDRALAZINE HCL 20 MG/ML IJ SOLN
10.0000 mg | INTRAMUSCULAR | Status: DC | PRN
  Filled 2011-08-01: qty 0.5

## 2011-08-01 MED ORDER — ONDANSETRON HCL 4 MG/2ML IJ SOLN: 4.0000 mg | Freq: Four times a day (QID) | INTRAMUSCULAR | Status: DC | PRN

## 2011-08-01 MED ORDER — INSULIN GLARGINE 100 UNIT/ML ~~LOC~~ SOLN
10.0000 [IU] | Freq: Once | SUBCUTANEOUS | Status: AC
  Administered 2011-08-01: 10 [IU] via SUBCUTANEOUS

## 2011-08-01 MED ORDER — SENNA 8.6 MG PO TABS
2.0000 | ORAL_TABLET | Freq: Once | ORAL | Status: AC
  Administered 2011-08-01: 17.2 mg via ORAL
  Filled 2011-08-01: qty 2

## 2011-08-01 MED ORDER — INSULIN ASPART 100 UNIT/ML ~~LOC~~ SOLN
0.0000 [IU] | Freq: Three times a day (TID) | SUBCUTANEOUS | Status: DC
  Administered 2011-08-01 (×2): 5 [IU] via SUBCUTANEOUS
  Administered 2011-08-01: 1 [IU] via SUBCUTANEOUS
  Administered 2011-08-02: 8 [IU] via SUBCUTANEOUS
  Administered 2011-08-02: 5 [IU] via SUBCUTANEOUS
  Administered 2011-08-02 – 2011-08-03 (×2): 3 [IU] via SUBCUTANEOUS
  Administered 2011-08-03: 5 [IU] via SUBCUTANEOUS

## 2011-08-01 MED ORDER — FESOTERODINE FUMARATE ER 4 MG PO TB24
4.0000 mg | ORAL_TABLET | Freq: Every day | ORAL | Status: DC
  Administered 2011-08-01 – 2011-08-03 (×3): 4 mg via ORAL
  Filled 2011-08-01 (×3): qty 1

## 2011-08-01 MED ORDER — ATORVASTATIN CALCIUM 10 MG PO TABS
10.0000 mg | ORAL_TABLET | Freq: Every day | ORAL | Status: DC
  Administered 2011-08-01 – 2011-08-02 (×2): 10 mg via ORAL
  Filled 2011-08-01 (×3): qty 1

## 2011-08-01 MED ORDER — INSULIN GLARGINE 100 UNIT/ML ~~LOC~~ SOLN
20.0000 [IU] | Freq: Every day | SUBCUTANEOUS | Status: DC
  Administered 2011-08-01: 20 [IU] via SUBCUTANEOUS

## 2011-08-01 MED ORDER — ACETAMINOPHEN 325 MG PO TABS
650.0000 mg | ORAL_TABLET | Freq: Four times a day (QID) | ORAL | Status: DC | PRN
  Administered 2011-08-02 – 2011-08-03 (×3): 650 mg via ORAL
  Filled 2011-08-01 (×3): qty 2

## 2011-08-01 MED ORDER — DEXTROSE 50 % IV SOLN: 25.0000 mL | INTRAVENOUS | Status: DC | PRN

## 2011-08-01 MED ORDER — ENOXAPARIN SODIUM 30 MG/0.3ML ~~LOC~~ SOLN
30.0000 mg | SUBCUTANEOUS | Status: DC
  Administered 2011-08-01 – 2011-08-02 (×2): 30 mg via SUBCUTANEOUS
  Filled 2011-08-01 (×2): qty 0.3

## 2011-08-01 MED ORDER — SODIUM CHLORIDE 0.9 % IV SOLN
INTRAVENOUS | Status: AC
  Administered 2011-08-01 (×3): 150 mL/h via INTRAVENOUS
  Administered 2011-08-01: 04:00:00 via INTRAVENOUS
  Administered 2011-08-02 (×3): 125 mL/h via INTRAVENOUS

## 2011-08-01 MED ORDER — SODIUM CHLORIDE 0.9 % IV SOLN: INTRAVENOUS | Status: AC

## 2011-08-01 MED ORDER — ONDANSETRON HCL 4 MG/2ML IJ SOLN: 4.0000 mg | Freq: Three times a day (TID) | INTRAMUSCULAR | Status: DC | PRN

## 2011-08-01 MED ORDER — ACETAMINOPHEN 650 MG RE SUPP: 650.0000 mg | Freq: Four times a day (QID) | RECTAL | Status: DC | PRN

## 2011-08-01 MED ORDER — SODIUM CHLORIDE 0.9 % IV SOLN
INTRAVENOUS | Status: DC
  Administered 2011-08-01: 03:00:00 via INTRAVENOUS

## 2011-08-01 MED ORDER — INSULIN REGULAR BOLUS VIA INFUSION
0.0000 [IU] | Freq: Once | INTRAVENOUS | Status: DC
  Filled 2011-08-01: qty 10

## 2011-08-01 MED ORDER — DEXTROSE-NACL 5-0.45 % IV SOLN: INTRAVENOUS | Status: DC

## 2011-08-01 NOTE — ED Notes (Signed)
Pt unable to provide urine specimen at this time

## 2011-08-01 NOTE — ED Notes (Addendum)
Pt presents with c/o elevated blood sugar x 10 days. States she was without medications from 07/17/11-07/26/11. Began taking medications again on 07/27/11 and has been unable to control blood sugar since. States she has been fatigued, polyuria and polydypsia since Friday and blood sugar has been 350-450 consistently since 07/27/11. States she took 50 units of lantus at app 2200 this evening. Pt A&ox3. Denies further needs at this time. PA at bedside for eval

## 2011-08-01 NOTE — H&P (Signed)
Katie West is an 56 y.o. female.   PCP - [facility]. Chief Complaint: Weakness. HPI: 56 year-old female with known history of diabetes mellitus type 2, hypertension and hyperlipidemia has been experiencing weakness over last 3 days. Patient states she is feeling tired fatigued. Denies any chest pain or shortness of breath nausea vomiting or diarrhea or dysuria. She ran out of her medications 10 days ago and was expecting to get refills sooner. But she got refills only last Thursday. She has been taking her medicines again as prescribed for last 3 days. In addition patient has noticed her blood sugar was running high despite restarting her Lantus. In the ER patient was found to have increased creatinine from a baseline of normal 2 years ago. Patient at this time has been admitted for acute renal failure probably from dehydration and uncontrolled diabetes.  Past Medical History  Diagnosis Date  . Diabetes mellitus   . Hypertension   . Hypercholesterolemia   . Arthritis of knee     Past Surgical History  Procedure Date  . Tubaligation 1981  . Ankle arthroscopy 2007    right    Family History  Problem Relation Age of Onset  . Diabetes type II Father    Social History:  reports that she quit smoking about 15 years ago. She does not have any smokeless tobacco history on file. She reports that she does not drink alcohol or use illicit drugs.  Allergies:  Allergies  Allergen Reactions  . Latex     REACTION: rash    Medications Prior to Admission  Medication Sig Dispense Refill  . aspirin EC 81 MG tablet Take 81 mg by mouth daily.      Marland Kitchen glimepiride (AMARYL) 4 MG tablet Take 4 mg by mouth 2 (two) times daily.      . Insulin Glargine (LANTUS SOLOSTAR [place]) Inject 50 Units into the skin at bedtime.      Marland Kitchen lisinopril (PRINIVIL,ZESTRIL) 10 MG tablet Take 10 mg by mouth daily.      . meloxicam (MOBIC) 7.5 MG tablet Take 7.5 mg by mouth 2 (two) times daily.      . metFORMIN (GLUCOPHAGE)  500 MG tablet Take 1,000 mg by mouth 2 (two) times daily with a meal.       . Pioglitazone HCl (ACTOS PO) Take 30 mg by mouth daily.      . rosuvastatin (CRESTOR) 5 MG tablet Take 5 mg by mouth daily.      . simvastatin (ZOCOR) 5 MG tablet Take 5 mg by mouth at bedtime.      . tolterodine (DETROL LA) 2 MG 24 hr capsule Take 2 mg by mouth daily.        Results for orders placed during the hospital encounter of 07/31/11 (from the past 48 hour(s))  GLUCOSE, CAPILLARY     Status: Abnormal   Collection Time   07/31/11 10:28 PM      Component Value Range Comment   Glucose-Capillary 330 (*) 70 - 99 mg/dL    Comment 1 Notify RN      Comment 2 Documented in Chart     BASIC METABOLIC PANEL     Status: Abnormal   Collection Time   07/31/11 10:31 PM      Component Value Range Comment   Sodium 138  135 - 145 mEq/L    Potassium 4.0  3.5 - 5.1 mEq/L    Chloride 100  96 - 112 mEq/L  CO2 22  19 - 32 mEq/L    Glucose, Bld 347 (*) 70 - 99 mg/dL    BUN 49 (*) 6 - 23 mg/dL    Creatinine, Ser 7.82 (*) 0.50 - 1.10 mg/dL    Calcium 9.7  8.4 - 95.6 mg/dL    GFR calc non Af Amer 23 (*) >90 mL/min    GFR calc Af Amer 26 (*) >90 mL/min   CBC     Status: Abnormal   Collection Time   07/31/11 10:31 PM      Component Value Range Comment   WBC 7.4  4.0 - 10.5 K/uL    RBC 4.43  3.87 - 5.11 MIL/uL    Hemoglobin 11.8 (*) 12.0 - 15.0 g/dL    HCT 21.3  08.6 - 57.8 %    MCV 83.7  78.0 - 100.0 fL    MCH 26.6  26.0 - 34.0 pg    MCHC 31.8  30.0 - 36.0 g/dL    RDW 46.9  62.9 - 52.8 %    Platelets 248  150 - 400 K/uL   DIFFERENTIAL     Status: Normal   Collection Time   07/31/11 10:31 PM      Component Value Range Comment   Neutrophils Relative 51  43 - 77 %    Neutro Abs 3.7  1.7 - 7.7 K/uL    Lymphocytes Relative 38  12 - 46 %    Lymphs Abs 2.8  0.7 - 4.0 K/uL    Monocytes Relative 10  3 - 12 %    Monocytes Absolute 0.8  0.1 - 1.0 K/uL    Eosinophils Relative 1  0 - 5 %    Eosinophils Absolute 0.1  0.0 -  0.7 K/uL    Basophils Relative 0  0 - 1 %    Basophils Absolute 0.0  0.0 - 0.1 K/uL   GLUCOSE, CAPILLARY     Status: Abnormal   Collection Time   08/01/11 12:58 AM      Component Value Range Comment   Glucose-Capillary 327 (*) 70 - 99 mg/dL   GLUCOSE, CAPILLARY     Status: Abnormal   Collection Time   08/01/11  1:51 AM      Component Value Range Comment   Glucose-Capillary 321 (*) 70 - 99 mg/dL    Comment 1 Documented in Chart      Comment 2 Notify RN      No results found.  Review of Systems  Constitutional: Positive for malaise/fatigue.  HENT: Negative.   Eyes: Negative.   Respiratory: Negative.   Cardiovascular: Negative.   Gastrointestinal: Negative.   Genitourinary: Negative.   Musculoskeletal: Negative.   Skin: Negative.   Neurological: Positive for weakness.  Endo/Heme/Allergies: Negative.   Psychiatric/Behavioral: Negative.     Blood pressure 107/78, pulse 94, temperature 97.9 F (36.6 C), temperature source Oral, resp. rate 16, SpO2 100.00%. Physical Exam  Constitutional: She is oriented to person, place, and time. She appears well-developed and well-nourished. No distress.  HENT:  Head: Normocephalic and atraumatic.  Right Ear: External ear normal.  Left Ear: External ear normal.  Nose: Nose normal.  Mouth/Throat: Oropharynx is clear and moist. No oropharyngeal exudate.  Eyes: Conjunctivae are normal. Pupils are equal, round, and reactive to light. Right eye exhibits no discharge. Left eye exhibits no discharge. No scleral icterus.  Neck: Normal range of motion. Neck supple.  Cardiovascular: Normal rate and regular rhythm.   Respiratory: Effort normal and breath  sounds normal. No respiratory distress. She has no wheezes. She has no rales.  GI: Soft. Bowel sounds are normal. She exhibits no distension. There is no tenderness. There is no rebound.  Musculoskeletal: Normal range of motion. She exhibits no edema and no tenderness.  Neurological: She is alert and  oriented to person, place, and time.       Moves all extremities.  Skin: Skin is warm and dry. No rash noted. She is not diaphoretic. No erythema.  Psychiatric: Her behavior is normal.     Assessment/Plan #1. Acute renal failure probably from dehydration from uncontrolled diabetes - at this time we will continue to hydrate with IV fluids. Hold off lisinopril and metformin for now. Discontinue NSAIDs. Urinalysis is still pending. Closely follow metabolic panel and intake output. We'll also check CK levels to rule out any rhabdomyolysis. #2. Uncontrolled diabetes but not in DKA - patient was started on IV insulin in the ER. Her CBG readings at this time is showing 290. We will discontinue IV glucose stabilizer and I have ordered one dose of Lantus 10 units subcutaneous now followed by me keep patient moderate dose insulin sliding scale coverage. Closely follow CBGs. #3. Hypertension - I am holding lisinopril due to acute renal failure. Will keep patient on when necessary IV hydralazine for systolic blood pressure more than 160. #4. Hyperlipidemia - continue statins. Hold if CK levels are high. #5. Anemia - patient states she did have a normal colonoscopy done 5 years ago. Follow CBC.  CODE STATUS - full code.  Brazen Domangue N. 08/01/2011, 3:28 AM

## 2011-08-01 NOTE — Progress Notes (Signed)
Subjective: Patient states she feels a lot better today. No new complaints. He reveals a patient with hypertension diabetes and another medication for approximately 10 days. She restarted her medications and she was seen by her primary care physician on the 12th.  Interval history: A review of the patient's records showed that in August of 2011 she had a creatinine of 0.9 Objective: Filed Vitals:   08/01/11 0209 08/01/11 0310 08/01/11 0953 08/01/11 1338  BP: 107/78 129/88 107/63 118/69  Pulse: 94 105 88 94  Temp:  98.5 F (36.9 C) 98.6 F (37 C) 98.4 F (36.9 C)  TempSrc:  Oral Oral Oral  Resp: 16 16 20 20   Height:  6\' 1"  (1.854 m)    Weight:  138.71 kg (305 lb 12.8 oz)    SpO2: 100% 98% 100% 98%   Weight change:   Intake/Output Summary (Last 24 hours) at 08/01/11 1352 Last data filed at 08/01/11 1100  Gross per 24 hour  Intake    240 ml  Output    500 ml  Net   -260 ml    General: Alert, awake, oriented x3, in no acute distress.  HEENT: [place]/AT PEERL, EOMI Neck: Trachea midline,  no masses, no thyromegal,y no JVD, no carotid bruit OROPHARYNX:  Moist, No exudate/ erythema/lesions.  Heart: Regular rate and rhythm, without murmurs, rubs, gallops, PMI non-displaced, no heaves or thrills on palpation.  Lungs: Clear to auscultation, no wheezing or rhonchi noted. No increased vocal fremitus resonant to percussion  Abdomen: Soft, nontender, nondistended, positive bowel sounds, no masses no hepatosplenomegaly noted..  Neuro: No focal neurological deficits noted cranial nerves II through XII grossly intact. DTRs 2+ bilaterally upper and lower extremities. Strength 5 out of 5 in bilateral upper and lower extremities. Musculoskeletal: No warm swelling or erythema around joints, no spinal tenderness noted. Psychiatric: Patient alert and oriented x3, good insight and cognition, good recent to remote recall.   Lab Results:  [facility] 08/01/11 0430 07/31/11 2231  NA 139 138  K 3.8 4.0  CL  102 100  CO2 22 22  GLUCOSE 310* 347*  BUN 46* 49*  CREATININE 2.06* 2.31*  CALCIUM 9.2 9.7  MG -- --  PHOS -- --    Basename 08/01/11 0430  AST 12  ALT 11  ALKPHOS 65  BILITOT 0.1*  PROT 6.3  ALBUMIN 3.1*   No results found for this basename: LIPASE:2,AMYLASE:2 in the last 72 hours  Basename 08/01/11 0430 07/31/11 2231  WBC 5.6 7.4  NEUTROABS -- 3.7  HGB 10.6* 11.8*  HCT 33.1* 37.1  MCV 83.0 83.7  PLT 197 248    Basename 08/01/11 0430  CKTOTAL 105  CKMB 1.6  CKMBINDEX --  TROPONINI <0.30   No components found with this basename: POCBNP:3 No results found for this basename: DDIMER:2 in the last 72 hours No results found for this basename: HGBA1C:2 in the last 72 hours No results found for this basename: CHOL:2,HDL:2,LDLCALC:2,TRIG:2,CHOLHDL:2,LDLDIRECT:2 in the last 72 hours  Basename 08/01/11 0430  TSH 1.739  T4TOTAL --  T3FREE --  THYROIDAB --   No results found for this basename: VITAMINB12:2,FOLATE:2,FERRITIN:2,TIBC:2,IRON:2,RETICCTPCT:2 in the last 72 hours  Micro Results: Recent Results (from the past 240 hour(s))  MRSA PCR SCREENING     Status: Normal   Collection Time   08/01/11  3:56 AM      Component Value Range Status Comment   MRSA by PCR NEGATIVE  NEGATIVE Final     Studies/Results: No results found.  Medications:  I have reviewed the patient's current medications. Scheduled Meds:   . sodium chloride   Intravenous STAT  . atorvastatin  10 mg Oral q1800  . enoxaparin  30 mg Subcutaneous Q24H  . fesoterodine  4 mg Oral Daily  . glimepiride  4 mg Oral BID WC  . insulin aspart  0-15 Units Subcutaneous TID WC  . insulin glargine  10 Units Subcutaneous Once  . insulin glargine  20 Units Subcutaneous QHS  . insulin (NOVOLIN-R) infusion   Intravenous To ER  . pioglitazone  30 mg Oral Daily  . sodium chloride  1,000 mL Intravenous Once  . DISCONTD: insulin glargine  50 Units Subcutaneous QHS  . DISCONTD: insulin regular  0-10 Units  Intravenous Once   Continuous Infusions:   . sodium chloride 150 mL/hr (08/01/11 1001)  . DISCONTD: sodium chloride 150 mL/hr at 08/01/11 0243  . DISCONTD: dextrose 5 % and 0.45% NaCl     PRN Meds:.acetaminophen, acetaminophen, hydrALAZINE, ondansetron (ZOFRAN) IV, ondansetron, DISCONTD: dextrose, DISCONTD: ondansetron (ZOFRAN) IV Assessment/Plan: Patient Active Hospital Problem List: ARF (acute renal failure) (08/01/2011)   Assessment: Patient with a significantly acute renal failure as compared to 2 years ago. The etiology of which is unclear. I will go ahead and get a renal ultrasound and also get a spot urine protein creatinine ratio to further evaluation. The urine shows no concerning sediments and no evidence of a urinary tract infection. We'll also continue hydration. Please note that the patient had also been on enalapril which could be a contributing factor at this point has been discontinued.    DM2 (diabetes mellitus, type 2) (08/01/2011)   Assessment: In light of the significant decrease in GFR. I will crease the patient's Lantus to 20 units subcutaneous at bedtime. I will also discontinue metformin and consider holding the Amaryl. We'll continue Actos and sliding scale insulin.    HTN (hypertension) (08/01/2011)   Assessment: At present blood pressures are marginal and blood pressure medications have been discontinued.    Hyperlipidemia (08/01/2011)   Assessment: Patient has been continued on Crestor.      LOS: 1 day

## 2011-08-01 NOTE — Progress Notes (Signed)
Pt transferred from the ED, admitted to Rm [id number]. Pt comes from home with family. She is alert and oriented. Ambulatory with standby assist. No skin breakdown noted. Pt is on Glucose Stabilizer. Oriented to room, instructed to call for assistance before getting out of bed. Resting comfortably at this time, will continue to monitor.  Stopped Insulin drip at 0445 per Dr Lars Masson, given Lantus prior to d/c. Per MD, do not need to wait 2hrs before stopping drip.  Katie West

## 2011-08-01 NOTE — ED Notes (Signed)
Repeat CBG 327

## 2011-08-01 NOTE — ED Notes (Signed)
Report to renee rn. Aware that insulin infusing and time for reassessment. Denies further questions at this time

## 2011-08-01 NOTE — ED Provider Notes (Signed)
History    56 y/o IDDM parenting with fatigue, thirst and polyuria x4 days. Pt has been out of all Rx x 10 days and has restarted all Rx x5 days ago. Pt denies Fever, SOB, CP, abd pain and n/v.   CSN: [id number]  Arrival date & time 07/31/11  2207   First MD Initiated Contact with Patient 08/01/11 0039      Chief Complaint  Patient presents with  . Hyperglycemia    (Consider location/radiation/quality/duration/timing/severity/associated sxs/prior treatment) The history is provided by the patient.    Past Medical History  Diagnosis Date  . Diabetes mellitus   . Hypertension   . Hypercholesterolemia   . Arthritis of knee     Past Surgical History  Procedure Date  . Tubaligation 1981  . Ankle arthroscopy 2007    right    No family history on file.  History  Substance Use Topics  . Smoking status: Former Smoker    Quit date: 02/14/1996  . Smokeless tobacco: Not on file  . Alcohol Use: No    OB History    Grav Para Term Preterm Abortions TAB SAB Ect Mult Living                  Review of Systems  All other systems reviewed and are negative.    Allergies  Latex  Home Medications   Current Outpatient Rx  Name Route Sig Dispense Refill  . ALBUTEROL SULFATE HFA 108 (90 BASE) MCG/ACT IN AERS Inhalation Inhale 1-2 puffs into the lungs every 6 (six) hours as needed for wheezing. 1 Inhaler 0  . GLIMEPIRIDE 4 MG PO TABS Oral Take 4 mg by mouth 2 (two) times daily.    . GUAIFENESIN ER 600 MG PO TB12 Oral Take 2 tablets (1,200 mg total) by mouth 2 (two) times daily. 28 tablet 0  . HYDROCODONE-ACETAMINOPHEN 5-325 MG PO TABS Oral Take 1-2 tablets by mouth every 6 (six) hours as needed for pain (cough). 20 tablet 0  . LANTUS SOLOSTAR [place] Subcutaneous Inject 50 Units into the skin at bedtime.    Marland Kitchen LISINOPRIL 10 MG PO TABS Oral Take 10 mg by mouth daily.    . MELOXICAM 7.5 MG PO TABS Oral Take 7.5 mg by mouth 2 (two) times daily.    Marland Kitchen METFORMIN HCL 500 MG PO TABS Oral  Take 500 mg by mouth 2 (two) times daily with a meal.    . ACTOS PO Oral Take 30 mg by mouth daily.    Marland Kitchen PRESCRIPTION MEDICATION  "water pill" unsure of name or dose    . CRESTOR PO Oral Take by mouth. Unsure of dose      BP 116/69  Pulse 102  Temp 97.9 F (36.6 C) (Oral)  Resp 16  SpO2 98%  Physical Exam  Vitals reviewed. Constitutional: She appears well-developed and well-nourished. No distress.  HENT:  Head: Normocephalic and atraumatic.       Dry mucous membranes  Eyes: Conjunctivae and EOM are normal. Pupils are equal, round, and reactive to light.  Neck: Normal range of motion.  Cardiovascular: Normal rate, regular rhythm and normal heart sounds.   Pulmonary/Chest: Effort normal and breath sounds normal.  Abdominal: Soft. Bowel sounds are normal.  Musculoskeletal: Normal range of motion.  Neurological: She is alert.       Normally conversant  Skin: Skin is warm.  Psychiatric: She has a normal mood and affect.    ED Course  Procedures (including critical care time)  Labs Reviewed  BASIC METABOLIC PANEL - Abnormal; Notable for the following:    Glucose, Bld 347 (*)     BUN 49 (*)     Creatinine, Ser 2.31 (*)     GFR calc non Af Amer 23 (*)     GFR calc Af Amer 26 (*)     All other components within normal limits  CBC - Abnormal; Notable for the following:    Hemoglobin 11.8 (*)     All other components within normal limits  GLUCOSE, CAPILLARY - Abnormal; Notable for the following:    Glucose-Capillary 330 (*)     All other components within normal limits  DIFFERENTIAL  GLUCOSE, CAPILLARY   No results found.   1. Hyperglycemia   2. Renal failure   3. ARF (acute renal failure)   4. DM2 (diabetes mellitus, type 2)   5. HTN (hypertension)   6. Anemia       MDM  DKA, AKI secondary to pre-renal dehydration. Pt admitted for observation.         Wynetta Emery, PA-C 08/06/11 1505

## 2011-08-01 NOTE — ED Notes (Signed)
56 year old female, history of hypertension and diabetes which has been untreated over the last 10 days until several days ago she cut back on her medications. She presents with fatigue, dehydration, hyperglycemia.  obese lady with a soft nontender abdomen, clear lungs and mild tachycardia but 105. It is temperature dry, no peripheral edema or rashes, normal bowel status.  :  Glycemia with mild anion gap acidosis, renal failure with elevated BUN. Discussed with hospitalist who will admit to the medical surgical floor. Glucose stabilizer ordered, fluids ordered.  Medical screening examination/treatment/procedure(s) were conducted as a shared visit with non-physician practitioner(s) and myself.  I personally evaluated the patient during the encounter    Vida Roller, MD 08/01/11 [phone]

## 2011-08-02 ENCOUNTER — Encounter (HOSPITAL_COMMUNITY): Payer: Self-pay | Admitting: Internal Medicine

## 2011-08-02 DIAGNOSIS — N179 Acute kidney failure, unspecified: Secondary | ICD-10-CM

## 2011-08-02 DIAGNOSIS — E1165 Type 2 diabetes mellitus with hyperglycemia: Secondary | ICD-10-CM

## 2011-08-02 DIAGNOSIS — I10 Essential (primary) hypertension: Secondary | ICD-10-CM

## 2011-08-02 DIAGNOSIS — D649 Anemia, unspecified: Secondary | ICD-10-CM | POA: Diagnosis present

## 2011-08-02 DIAGNOSIS — E782 Mixed hyperlipidemia: Secondary | ICD-10-CM

## 2011-08-02 HISTORY — DX: Anemia, unspecified: D64.9

## 2011-08-02 LAB — GLUCOSE, CAPILLARY
Glucose-Capillary: 169 mg/dL — ABNORMAL HIGH (ref 70–99)
Glucose-Capillary: 179 mg/dL — ABNORMAL HIGH (ref 70–99)

## 2011-08-02 LAB — BASIC METABOLIC PANEL
Calcium: 9.5 mg/dL (ref 8.4–10.5)
Creatinine, Ser: 1.24 mg/dL — ABNORMAL HIGH (ref 0.50–1.10)
GFR calc Af Amer: 56 mL/min — ABNORMAL LOW (ref >90)

## 2011-08-02 LAB — FERRITIN: Ferritin: 33 ng/mL (ref 10–291)

## 2011-08-02 LAB — IRON AND TIBC
Iron: 76 ug/dL (ref 42–135)
UIBC: 247 ug/dL (ref 125–400)

## 2011-08-02 MED ORDER — ENOXAPARIN SODIUM 80 MG/0.8ML ~~LOC~~ SOLN
70.0000 mg | SUBCUTANEOUS | Status: DC
  Filled 2011-08-02: qty 0.8

## 2011-08-02 MED ORDER — INSULIN GLARGINE 100 UNIT/ML ~~LOC~~ SOLN
26.0000 [IU] | Freq: Every day | SUBCUTANEOUS | Status: DC
  Administered 2011-08-02: 26 [IU] via SUBCUTANEOUS

## 2011-08-02 MED ORDER — PNEUMOCOCCAL VAC POLYVALENT 25 MCG/0.5ML IJ INJ
0.5000 mL | INJECTION | INTRAMUSCULAR | Status: AC
  Administered 2011-08-03: 0.5 mL via INTRAMUSCULAR
  Filled 2011-08-02: qty 0.5

## 2011-08-02 NOTE — Progress Notes (Signed)
Subjective: Patient states he feels much better. Patient denies any shortness of breath. No complaints.  Objective: Vital signs in last 24 hours: Filed Vitals:   08/01/11 1338 08/01/11 1801 08/01/11 2129 08/02/11 0600  BP: 118/69 111/64 118/81 119/73  Pulse: 94 85 90 94  Temp: 98.4 F (36.9 C) 98.6 F (37 C) 98 F (36.7 C) 97.6 F (36.4 C)  TempSrc: Oral Oral Oral Oral  Resp: 20 20 20 18   Height:   6\' 1"  (1.854 m)   Weight:   141.5 kg (311 lb 15.2 oz)   SpO2: 98% 100% 97% 95%    Intake/Output Summary (Last 24 hours) at 08/02/11 0932 Last data filed at 08/02/11 0756  Gross per 24 hour  Intake   2880 ml  Output   4600 ml  Net  -1720 ml    Weight change: 2.79 kg (6 lb 2.4 oz)  General: Alert, awake, oriented x3, in no acute distress. HEENT: No bruits, no goiter. Heart: Regular rate and rhythm, without murmurs, rubs, gallops. Lungs: Clear to auscultation bilaterally. Abdomen: Soft, nontender, nondistended, positive bowel sounds. Extremities: No clubbing cyanosis or edema with positive pedal pulses. Neuro: Grossly intact, nonfocal.    Lab Results:  Basename 08/02/11 0700 08/01/11 0430  NA 141 139  K 4.1 3.8  CL 108 102  CO2 22 22  GLUCOSE PENDING 310*  BUN 28* 46*  CREATININE PENDING 2.06*  CALCIUM 9.5 9.2  MG -- --  PHOS -- --    Basename 08/01/11 0430  AST 12  ALT 11  ALKPHOS 65  BILITOT 0.1*  PROT 6.3  ALBUMIN 3.1*   No results found for this basename: LIPASE:2,AMYLASE:2 in the last 72 hours  Basename 08/01/11 0430 07/31/11 2231  WBC 5.6 7.4  NEUTROABS -- 3.7  HGB 10.6* 11.8*  HCT 33.1* 37.1  MCV 83.0 83.7  PLT 197 248    Basename 08/01/11 0430  CKTOTAL 105  CKMB 1.6  CKMBINDEX --  TROPONINI <0.30   No components found with this basename: POCBNP:3 No results found for this basename: DDIMER:2 in the last 72 hours  Basename 08/01/11 0807  HGBA1C 11.1*   No results found for this basename:  CHOL:2,HDL:2,LDLCALC:2,TRIG:2,CHOLHDL:2,LDLDIRECT:2 in the last 72 hours  Basename 08/01/11 0430  TSH 1.739  T4TOTAL --  T3FREE --  THYROIDAB --   No results found for this basename: VITAMINB12:2,FOLATE:2,FERRITIN:2,TIBC:2,IRON:2,RETICCTPCT:2 in the last 72 hours  Micro Results: Recent Results (from the past 240 hour(s))  MRSA PCR SCREENING     Status: Normal   Collection Time   08/01/11  3:56 AM      Component Value Range Status Comment   MRSA by PCR NEGATIVE  NEGATIVE Final     Studies/Results: US Renal  08/01/2011  *RADIOLOGY REPORT*  Clinical Data: Acute renal failure.  RENAL/URINARY TRACT ULTRASOUND COMPLETE  Comparison:  CT 08/17/2008  Findings:  Right Kidney:  13.7 cm. Normal size and echotexture.  No focal abnormality.  No hydronephrosis.  Left Kidney:  13.0 cm. Normal size and echotexture.  No focal abnormality.  No hydronephrosis.  Bladder:  Decompressed and not well visualized.  IMPRESSION: No acute findings.  Original Report Authenticated By: Cyndie Chime, M.D.    Medications:     . sodium chloride   Intravenous STAT  . atorvastatin  10 mg Oral q1800  . enoxaparin  30 mg Subcutaneous Q24H  . fesoterodine  4 mg Oral Daily  . glimepiride  4 mg Oral BID WC  . insulin aspart  0-15 Units Subcutaneous TID WC  . insulin glargine  20 Units Subcutaneous QHS  . pioglitazone  30 mg Oral Daily  . senna  2 tablet Oral Once  . DISCONTD: insulin glargine  50 Units Subcutaneous QHS    Assessment: Principal Problem:  *ARF (acute renal failure) Active Problems:  DM2 (diabetes mellitus, type 2)  HTN (hypertension)  Hyperlipidemia  Anemia   Plan: #1. ARF (acute renal failure) (08/01/2011) Assessment: Patient with a significantly acute renal failure as compared to 2 years ago. The etiology of which is unclear. Renal ultrasound with no acute findings. Spot urine protein creatinine ratio with a urinary protein of 4.3 mg/dL and a urinary creatinine of 83.69. Patient with some  mild proteinuria.. The urine shows no concerning sediments and no evidence of a urinary tract infection. We'll also continue hydration. Please note that the patient had also been on enalapril which could be a contributing factor at this point has been discontinued. Renal function is pending for today. Continue current IV fluids and follow.    #2 uncontrolled DM2 (diabetes mellitus, type 2) (08/01/2011)  Assessment: Patient hemoglobin A1c is 11.1. Patient states was on 50 units of Lantus at home. Patient's Lantus was increased to 20 units daily yesterday. Patient CBGs ranged from 214 -349. I will increase the patient's Lantus to 26 units subcutaneous at bedtime. Metformin has been discontinued. We'll also discontinue patient's Amaryl for now.We'll continue Actos and sliding scale insulin. Diabetes education.    #3 HTN (hypertension) (08/01/2011) Assessment: At present blood pressures are marginal and blood pressure medications have been discontinued. Follow blood pressure.  #4 anemia No overt GI bleed. Will check an anemia panel. Follow H&H.    #5. Hyperlipidemia (08/01/2011) Assessment: Patient has been continued on Lipitor.    LOS: 2 days   Katie West [phone] 08/02/2011, 9:32 AM

## 2011-08-02 NOTE — Progress Notes (Signed)
ANTICOAGULATION CONSULT NOTE - Follow Up Consult  Pharmacy Consult for Lovenox Indication: VTE prophylaxis  Allergies  Allergen Reactions  . Latex     REACTION: rash    Patient Measurements: Height: 6\' 1"  (185.4 cm) Weight: 311 lb 15.2 oz (141.5 kg) IBW/kg (Calculated) : 75.4   Vital Signs: Temp: 97.6 F (36.4 C) (06/19 0939) Temp src: Oral (06/19 0939) BP: 138/71 mmHg (06/19 0939) Pulse Rate: 96  (06/19 0939)  Labs:  Basename 08/02/11 0700 08/01/11 0430 07/31/11 2231  HGB -- 10.6* 11.8*  HCT -- 33.1* 37.1  PLT -- 197 248  APTT -- -- --  LABPROT -- -- --  INR -- -- --  HEPARINUNFRC -- -- --  CREATININE 1.24* 2.06* 2.31*  CKTOTAL -- 105 --  CKMB -- 1.6 --  TROPONINI -- <0.30 --    Estimated Creatinine Clearance: 82.4 ml/min (by C-G formula based on Cr of 1.24).   Medications:  Scheduled:    . sodium chloride   Intravenous STAT  . atorvastatin  10 mg Oral q1800  . enoxaparin  30 mg Subcutaneous Q24H  . fesoterodine  4 mg Oral Daily  . insulin aspart  0-15 Units Subcutaneous TID WC  . insulin glargine  26 Units Subcutaneous QHS  . pioglitazone  30 mg Oral Daily  . pneumococcal 23 valent vaccine  0.5 mL Intramuscular Tomorrow-1000  . senna  2 tablet Oral Once  . DISCONTD: glimepiride  4 mg Oral BID WC  . DISCONTD: insulin glargine  20 Units Subcutaneous QHS    Assessment: 56 yo F admitted with ARF thought 2/2 ACEi use.  SCr has improved to 1.24 with calculated CrCl ~ 80.  Noted pt wt = 141 with BMI>30.  Goal of Therapy:  Anti Xa level 0.3-0.6 (4 hours after dose given).   Plan:  Increase Lovenox to 70 mg SQ Q24h.  Dose is 0.5mg /kg/day based on VTE prophylaxis dosing in obesity.  [employer], Pharm.D., BCPS Clinical Pharmacist Pager [phone] 08/02/2011 1:53 PM

## 2011-08-02 NOTE — Progress Notes (Signed)
Inpatient Diabetes Program Recommendations  AACE/ADA: New Consensus Statement on Inpatient Glycemic Control  Target Ranges:  Prepandial:   less than 140 mg/dL      Peak postprandial:   less than 180 mg/dL (1-2 hours)      Critically ill patients:  140 - 180 mg/dL  Pager:  [phone] Hours:  8 am-10pm   Reason for Visit: Consult for uncontrolled Diabetes  Inpatient Diabetes Program Recommendations Insulin - Meal Coverage: Consider adding Novolog 3 units TID with meals Outpatient Referral: HgbA1C: 11.1%:  Please order diabetes outpatient education.   Alfredia Client PhD, RN Diabetes Coordinator  Office:  [phone] Team Pager:  [phone]

## 2011-08-03 DIAGNOSIS — E782 Mixed hyperlipidemia: Secondary | ICD-10-CM

## 2011-08-03 DIAGNOSIS — E1165 Type 2 diabetes mellitus with hyperglycemia: Secondary | ICD-10-CM

## 2011-08-03 DIAGNOSIS — I10 Essential (primary) hypertension: Secondary | ICD-10-CM

## 2011-08-03 DIAGNOSIS — N179 Acute kidney failure, unspecified: Secondary | ICD-10-CM

## 2011-08-03 LAB — BASIC METABOLIC PANEL
BUN: 17 mg/dL (ref 6–23)
Calcium: 9.7 mg/dL (ref 8.4–10.5)
GFR calc Af Amer: 71 mL/min — ABNORMAL LOW (ref >90)
GFR calc non Af Amer: 61 mL/min — ABNORMAL LOW (ref >90)
Glucose, Bld: 208 mg/dL — ABNORMAL HIGH (ref 70–99)

## 2011-08-03 LAB — CBC
MCH: 26.3 pg (ref 26.0–34.0)
MCHC: 31.3 g/dL (ref 30.0–36.0)
Platelets: 199 10*3/uL (ref 150–400)
RDW: 14.1 % (ref 11.5–15.5)

## 2011-08-03 LAB — GLUCOSE, CAPILLARY: Glucose-Capillary: 188 mg/dL — ABNORMAL HIGH (ref 70–99)

## 2011-08-03 MED ORDER — POLYETHYLENE GLYCOL 3350 17 G PO PACK
17.0000 g | PACK | Freq: Two times a day (BID) | ORAL | Status: DC
Start: 2011-08-03 — End: 2011-08-03
  Administered 2011-08-03: 17 g via ORAL
  Filled 2011-08-03 (×2): qty 1

## 2011-08-03 MED ORDER — AMLODIPINE BESYLATE 5 MG PO TABS
5.0000 mg | ORAL_TABLET | Freq: Every day | ORAL | Status: DC
  Administered 2011-08-03: 5 mg via ORAL
  Filled 2011-08-03 (×2): qty 1

## 2011-08-03 MED ORDER — INSULIN GLARGINE 100 UNIT/ML ~~LOC~~ SOLN: 28.0000 [IU] | Freq: Every day | SUBCUTANEOUS | Status: DC

## 2011-08-03 MED ORDER — AMLODIPINE BESYLATE 5 MG PO TABS: 5.0000 mg | ORAL_TABLET | Freq: Every day | ORAL | Status: DC

## 2011-08-03 MED ORDER — POLYETHYLENE GLYCOL 3350 17 G PO PACK: 17.0000 g | PACK | Freq: Two times a day (BID) | ORAL | Status: AC | PRN

## 2011-08-03 NOTE — Progress Notes (Signed)
AVS reviewed with pt and daughter at bedside. Pt able to verbalize understanding of AVS with no questions. IV removed. Pt remains stable. Ambulatory pt and daughter escorted off unit. [place], Rosanna Randy

## 2011-08-03 NOTE — Progress Notes (Signed)
Met with patient before d/c.  Reviewed home diabetes medications with patient.  Discussed with patient her decreased dose of Lantus that she will be d/c'd home on.  Patient had specific questions about diet/carbohydrates.  Discussed basic carbohydrate counting principles with patient.  Gave her several examples of carbohydrates and basic portion size information.  Patient appreciative of all information.  She [facility] after d/c for further diabetes education.  [facility] entered into chart.  Will follow.  Ambrose Finland RN, MSN, CDE Diabetes Coordinator Inpatient Diabetes Program [phone]

## 2011-08-03 NOTE — Discharge Summary (Signed)
Discharge Summary  Katie West MR#: [id number]  DOB:10/22/1955  Date of Admission: 07/31/2011 Date of Discharge: 08/03/2011  Patient's PCP: Georganna Skeans, MD  Attending Physician:Tyyonna Soucy  Consults:   None  Discharge Diagnoses: ARF (acute renal failure) Present on Admission:  .ARF (acute renal failure) .DM2 (diabetes mellitus, type 2) .HTN (hypertension) .Hyperlipidemia .Anemia   Brief Admitting History and Physical 56 year-old female with known history of diabetes mellitus type 2, hypertension and hyperlipidemia has been experiencing weakness over last 3 days. Patient states she is feeling tired fatigued. Denies any chest pain or shortness of breath nausea vomiting or diarrhea or dysuria. She ran out of her medications 10 days ago and was expecting to get refills sooner. But she got refills only last Thursday. She has been taking her medicines again as prescribed for last 3 days. In addition patient has noticed her blood sugar was running high despite restarting her Lantus. In the ER patient was found to have increased creatinine from a baseline of normal 2 years ago. Patient at this time has been admitted for acute renal failure probably from dehydration and uncontrolled diabetes. For the rest of admission history and physical please see H&P dictated by Dr. Toniann Fail.   Discharge Medications Medication List  As of 08/03/2011 10:15 AM   START taking these medications         amLODipine 5 MG tablet   Commonly known as: NORVASC   Take 1 tablet (5 mg total) by mouth daily.      polyethylene glycol packet   Commonly known as: MIRALAX / GLYCOLAX   Take 17 g by mouth 2 (two) times daily as needed. For constipation.         CHANGE how you take these medications         insulin glargine 100 UNIT/ML injection   Commonly known as: LANTUS   Inject 28 Units into the skin at bedtime.   What changed: - medication strength - dose         CONTINUE taking these  medications         ACTOS PO      aspirin EC 81 MG tablet      glimepiride 4 MG tablet   Commonly known as: AMARYL      meloxicam 7.5 MG tablet   Commonly known as: MOBIC      metFORMIN 500 MG tablet   Commonly known as: GLUCOPHAGE      rosuvastatin 5 MG tablet   Commonly known as: CRESTOR      tolterodine 2 MG 24 hr capsule   Commonly known as: DETROL LA         STOP taking these medications         lisinopril 10 MG tablet      simvastatin 5 MG tablet          Where to get your medications    These are the prescriptions that you need to pick up.   You may get these medications from any pharmacy.         amLODipine 5 MG tablet   insulin glargine 100 UNIT/ML injection   polyethylene glycol packet           Hospital Course: #1 ARF (acute renal failure) On admission patient was noted to be in acute renal failure with a creatinine of 2.31. It was felt patient's acute renal failure was likely prerenal azotemia secondary to dehydration in the setting of ACE inhibitor. Patient's ACE inhibitor and Glucophage  were discontinued. Patient was placed on IV fluids. And NSAIDs were discontinued. Urinalysis which was done was negative for any proteinuria. A urine protein creatinine ratio which was done came back at 0.05. Patient was monitored patient's renal function improved on a daily basis such that by day of discharge her acute renal failure had resolved. Patient will be discharged home off her ACE inhibitor. Patient will need to followup with PCP one week post discharge with a repeat basic metabolic profile need to be done to followup on her renal function. On followup with PCP he'll be deferred to patient's PCP when and whether to resume her ACE inhibitor. On day of discharge patient's creatinine was down to 1.01. Patient be discharged in stable and improved condition.  #2 hypertension On admission patient was noted to be on an ACE inhibitor. Due to problem #1 patient's ACE  inhibitor was discontinued. Patient's blood pressure remained stable during the hospitalization and patient has been started on 5 mg of Norvasc daily for blood pressure control until she follows up with her PCP. All followup with PCP OB determine per PCP as to whether to continue patient on 5 mg of Norvasc or rechallenge patient with an ACE inhibitor. Patient be discharged in stable and improved condition.  #3 uncontrolled type 2 diabetes On admission patient was noted to be diabetic patient however states was on 50 units of Lantus daily, on Actos, on metformin, and on Amaryl. Patient's blood glucose levels on admission were in the 300s. Hemoglobin A1c which was obtained become elevated at 11.1. Patient was initially started on low-dose Lantus and that was increased to 28 units daily. Patient's blood sugar levels improved on 28 units of Lantus and range from 169-188. Patient will be discharged home on 28 units of Lantus in addition to her oral hypoglycemic agents. Patient will be set up for outpatient diabetes education. Diabetes coordinator saw the patient during this hospitalization. Patient will need to followup with PCP as outpatient.  The rest of patient's chronic medical issues remained stable throughout the hospitalization and patient be discharged in stable and improved condition. It was as a pleasure taking care of Ms. Galloway.   Present on Admission:  .ARF (acute renal failure) .DM2 (diabetes mellitus, type 2) .HTN (hypertension) .Hyperlipidemia .Anemia   Day of Discharge BP 143/80  Pulse 82  Temp 98.4 F (36.9 C) (Oral)  Resp 18  Ht 6\' 1"  (1.854 m)  Wt 141.5 kg (311 lb 15.2 oz)  BMI 41.16 kg/m2  SpO2 100% Subjective: No complaints. General: Alert, awake, oriented x3, in no acute distress. HEENT: No bruits, no goiter. Heart: Regular rate and rhythm, without murmurs, rubs, gallops. Lungs: Clear to auscultation bilaterally. Abdomen: Soft, nontender, nondistended, positive  bowel sounds. Extremities: No clubbing cyanosis or edema with positive pedal pulses. Neuro: Grossly intact, nonfocal.   Results for orders placed during the hospital encounter of 07/31/11 (from the past 48 hour(s))  GLUCOSE, CAPILLARY     Status: Abnormal   Collection Time   08/01/11 11:57 AM      Component Value Range Comment   Glucose-Capillary 225 (*) 70 - 99 mg/dL    Comment 1 Documented in Chart      Comment 2 Notify RN     PROTEIN / CREATININE RATIO, URINE     Status: Normal   Collection Time   08/01/11  3:58 PM      Component Value Range Comment   Creatinine, Urine 83.69      Total Protein, Urine  4.3   NO NORMAL RANGE ESTABLISHED FOR THIS TEST   PROTEIN CREATININE RATIO 0.05  0.00 - 0.15   GLUCOSE, CAPILLARY     Status: Abnormal   Collection Time   08/01/11  4:28 PM      Component Value Range Comment   Glucose-Capillary 246 (*) 70 - 99 mg/dL    Comment 1 Documented in Chart      Comment 2 Notify RN     GLUCOSE, CAPILLARY     Status: Abnormal   Collection Time   08/01/11  9:32 PM      Component Value Range Comment   Glucose-Capillary 349 (*) 70 - 99 mg/dL   BASIC METABOLIC PANEL     Status: Abnormal   Collection Time   08/02/11  7:00 AM      Component Value Range Comment   Sodium 141  135 - 145 mEq/L    Potassium 4.1  3.5 - 5.1 mEq/L    Chloride 108  96 - 112 mEq/L    CO2 22  19 - 32 mEq/L    Glucose, Bld 269 (*) 70 - 99 mg/dL    BUN 28 (*) 6 - 23 mg/dL DELTA CHECK NOTED   Creatinine, Ser 1.24 (*) 0.50 - 1.10 mg/dL    Calcium 9.5  8.4 - 86.5 mg/dL    GFR calc non Af Amer 48 (*) >90 mL/min    GFR calc Af Amer 56 (*) >90 mL/min   GLUCOSE, CAPILLARY     Status: Abnormal   Collection Time   08/02/11  7:35 AM      Component Value Range Comment   Glucose-Capillary 214 (*) 70 - 99 mg/dL   VITAMIN H84     Status: Normal   Collection Time   08/02/11 10:15 AM      Component Value Range Comment   Vitamin B-12 480  211 - 911 pg/mL   FOLATE     Status: Normal   Collection  Time   08/02/11 10:15 AM      Component Value Range Comment   Folate 12.0     IRON AND TIBC     Status: Normal   Collection Time   08/02/11 10:15 AM      Component Value Range Comment   Iron 76  42 - 135 ug/dL    TIBC 696  295 - 284 ug/dL    Saturation Ratios 24  20 - 55 %    UIBC 247  125 - 400 ug/dL   FERRITIN     Status: Normal   Collection Time   08/02/11 10:15 AM      Component Value Range Comment   Ferritin 33  10 - 291 ng/mL   GLUCOSE, CAPILLARY     Status: Abnormal   Collection Time   08/02/11 11:42 AM      Component Value Range Comment   Glucose-Capillary 260 (*) 70 - 99 mg/dL    Comment 1 Notify RN      Comment 2 Documented in Chart     GLUCOSE, CAPILLARY     Status: Abnormal   Collection Time   08/02/11  4:54 PM      Component Value Range Comment   Glucose-Capillary 169 (*) 70 - 99 mg/dL    Comment 1 Notify RN      Comment 2 Documented in Chart     GLUCOSE, CAPILLARY     Status: Abnormal   Collection Time   08/02/11  9:18 PM  Component Value Range Comment   Glucose-Capillary 179 (*) 70 - 99 mg/dL   BASIC METABOLIC PANEL     Status: Abnormal   Collection Time   08/03/11  6:00 AM      Component Value Range Comment   Sodium 141  135 - 145 mEq/L    Potassium 4.1  3.5 - 5.1 mEq/L    Chloride 108  96 - 112 mEq/L    CO2 26  19 - 32 mEq/L    Glucose, Bld 208 (*) 70 - 99 mg/dL    BUN 17  6 - 23 mg/dL    Creatinine, Ser 2.40  0.50 - 1.10 mg/dL    Calcium 9.7  8.4 - 97.3 mg/dL    GFR calc non Af Amer 61 (*) >90 mL/min    GFR calc Af Amer 71 (*) >90 mL/min   CBC     Status: Abnormal   Collection Time   08/03/11  6:00 AM      Component Value Range Comment   WBC 4.7  4.0 - 10.5 K/uL    RBC 4.11  3.87 - 5.11 MIL/uL    Hemoglobin 10.8 (*) 12.0 - 15.0 g/dL    HCT 53.2 (*) 99.2 - 46.0 %    MCV 83.9  78.0 - 100.0 fL    MCH 26.3  26.0 - 34.0 pg    MCHC 31.3  30.0 - 36.0 g/dL    RDW 42.6  83.4 - 19.6 %    Platelets 199  150 - 400 K/uL   GLUCOSE, CAPILLARY     Status:  Abnormal   Collection Time   08/03/11  7:34 AM      Component Value Range Comment   Glucose-Capillary 188 (*) 70 - 99 mg/dL    Comment 1 Documented in Chart       US Renal  08/01/2011  *RADIOLOGY REPORT*  Clinical Data: Acute renal failure.  RENAL/URINARY TRACT ULTRASOUND COMPLETE  Comparison:  CT 08/17/2008  Findings:  Right Kidney:  13.7 cm. Normal size and echotexture.  No focal abnormality.  No hydronephrosis.  Left Kidney:  13.0 cm. Normal size and echotexture.  No focal abnormality.  No hydronephrosis.  Bladder:  Decompressed and not well visualized.  IMPRESSION: No acute findings.  Original Report Authenticated By: Cyndie Chime, M.D.     Disposition: Home  Diet: Carb modified diet  Activity: Increase activity slowly   Follow-up Appts: Discharge Orders    Future Appointments: Provider: Department: Dept Phone: Center:   10/17/2011 8:15 AM Sherrie George, MD [facility] [phone] None     Future Orders Please Complete By Expires   Diet Carb Modified      Increase activity slowly      Discharge instructions      Comments:   Follow up with Georganna Skeans, MD in 1 week. Follow up for outpatient diabetes education.      TESTS THAT NEED FOLLOW-UP Patient will need a BMET in 1 week for followup with PCP.  Time spent on discharge, talking to the patient, and coordinating care: 50 mins.   Signed: Jordell Outten [phone] 08/03/2011, 10:15 AM

## 2011-08-13 NOTE — ED Provider Notes (Signed)
Medical screening examination/treatment/procedure(s) were conducted as a shared visit with non-physician practitioner(s) and myself.  I personally evaluated the patient during the encounter  Please see my separate respective documentation pertaining to this patient encounter   Vida Roller, MD 08/13/11 [phone]

## 2011-08-21 ENCOUNTER — Encounter: Payer: Self-pay | Admitting: *Deleted

## 2011-08-21 ENCOUNTER — Encounter: Payer: Medicare Other | Attending: Family Medicine | Admitting: *Deleted

## 2011-08-21 VITALS — Ht 73.0 in | Wt 308.6 lb

## 2011-08-21 DIAGNOSIS — E119 Type 2 diabetes mellitus without complications: Secondary | ICD-10-CM | POA: Insufficient documentation

## 2011-08-21 DIAGNOSIS — Z713 Dietary counseling and surveillance: Secondary | ICD-10-CM | POA: Insufficient documentation

## 2011-08-21 DIAGNOSIS — E669 Obesity, unspecified: Secondary | ICD-10-CM | POA: Insufficient documentation

## 2011-08-21 NOTE — Progress Notes (Signed)
Medical Nutrition Therapy:  Appt start time: 1130 end time:  1230.  Assessment:  Primary concerns today: patient here with her grown daughter for assistance with weight loss and diabetes management. She states she has had diabetes education in the past and does express fair understanding of carb counting. She states she was in hospital recently due to uncontrolled diabetes as a result of running out of her medications for about 10 days. She is taking them as directed now and is testing her BG 6-8 times a day. She does not have a fast acting insulin to make correction doses at this time.   MEDICATIONS: see list. Diabetes medications include Actos, Amaryl, Metformin and Lantus   DIETARY INTAKE:  Usual eating pattern includes 3 meals and 1-2 snacks per day.  Everyday foods include good variety of all food groups.  Avoided foods include typically high fat or high sugar foods.    24-hr recall:  B ( AM): 2 slices [place] bacon, 1 egg, cheese, 2 slices toast and occasionally grits OR pancake with eggs and bacon, 2% milk  Snk ( AM): occasionally a banana or other fresh fruit  L ( PM): salad OR sweet potato with meat, with diet Mountain Dew Snk ( PM): none D ( PM): fish sandwich, fries, diet soda OR uses Plate Method with lean meat, starch and vegetables, water Snk ( PM): after walk: cheese and fresh fruit OR graham crackers Beverages: water, diet soda  Usual physical activity: walk every evening for about 30 minutes, occasional walk in stores, play WII with son  Estimated energy needs: 1500 calories 170 g carbohydrates 112 g protein 42 g fat  Progress Towards Goal(s):  In progress.   Nutritional Diagnosis:  NI-1.5 Excessive energy intake As related to activity level.  As evidenced by BMI of 40.8.    Intervention:  Nutrition counseling and diabetes education provided. Discussed basic physiology of diabetes, SMBG and rationale of checking BG at alternate times of day, A1c, Carb Counting and  reading food labels, and benefits of increased activity. Discussed action of each of her medications and benefit of possibly getting on Intensive Insulin Therapy to more accurately manage her BG. Plan: Aim for 3 Carb Choices (45 grams) per meal +/- 1 either way Aim for 0-1 Carb Choices per snack if hungry Continue checking BG 4-6 times per day as directed by MD, alternating between pre and post meal times Continue to read food labels for total carbohydrate of foods Continue walking every evening for 30 minutes Consider concept of Intensive Insulin therapy if OK with MD to be able to correct for hyperglycemia  Handouts given during visit include: Living Well with Diabetes Carb Counting and Food Label handouts Meal Plan Card Diabetes Medication List  Monitoring/Evaluation:  Dietary intake, exercise, medication schedule, and body weight in 2 week(s).

## 2011-08-21 NOTE — Patient Instructions (Signed)
Plan: Aim for 3 Carb Choices (45 grams) per meal +/- 1 either way Aim for 0-1 Carb Choices per snack if hungry Continue checking BG 4-6 times per day as directed by MD, alternating between pre and post meal times Continue to read food labels for total carbohydrate of foods Continue walking every evening for 30 minutes Consider concept of Intensive Insulin therapy if OK with MD to be able to correct for hyperglycemia

## 2011-09-04 ENCOUNTER — Encounter: Payer: Self-pay | Admitting: *Deleted

## 2011-09-04 ENCOUNTER — Encounter: Payer: Medicare Other | Admitting: *Deleted

## 2011-09-04 VITALS — Ht 73.0 in | Wt 313.5 lb

## 2011-09-04 DIAGNOSIS — E119 Type 2 diabetes mellitus without complications: Secondary | ICD-10-CM

## 2011-09-04 NOTE — Patient Instructions (Signed)
Plan: Continue to aim for 3 Carb Choices (45 grams) per meal +/- 1 either way Aim for 0-1 Carb Choices per snack if hungry Continue checking BG 4-6 times per day as directed by MD, alternating between pre and post meal times Continue to read food labels for total carbohydrate of foods Continue walking every morning and evening as able for 30 minutes Consider concept of Intensive Insulin therapy if OK with MD to be able to correct for hyperglycemia

## 2011-09-04 NOTE — Progress Notes (Signed)
Medical Nutrition Therapy:  Appt start time: 0945 end time:  1015.  Assessment:  Primary concerns today: patient here for follow up appointment for her diabetes and obesity. She has her medications now and is doing much better. She states she is aiming for 3 carb choices per meal, is testing her BG 3-4 times a day with most results within her target range, and she is exercising by walking 1 1/2 miles twice a day most days.  MEDICATIONS: see list. Diabetes medications include Actos, Amaryl, Metformin and Lantus   DIETARY INTAKE:  Usual eating pattern includes 3 meals and 1-2 snacks per day.  Everyday foods include good variety of all food groups.  Avoided foods include typically high fat or high sugar foods.    24-hr recall:  B ( AM): 2 slices [place] bacon, 1 egg, cheese, 2 slices toast and occasionally grits OR pancake with eggs and bacon, 2% milk  Snk ( AM): occasionally a banana or other fresh fruit  L ( PM): salad OR sweet potato with meat, with diet Mountain Dew Snk ( PM): none D ( PM): fish sandwich, diet soda OR uses Plate Method with lean meat, starch and vegetables, water Snk ( PM): after walk: cheese and fresh fruit OR graham crackers Beverages: water, diet soda  Usual physical activity: walk every evening for about 30 minutes, occasional walk in stores, play WII with son  Estimated energy needs: 1500 calories 170 g carbohydrates 112 g protein 42 g fat  Progress Towards Goal(s):  In progress.   Nutritional Diagnosis:  NI-1.5 Excessive energy intake As related to activity level.  As evidenced by BMI of 40.8.    Intervention: Patient states she now feels better able to manage her diabetes and that her BG's are better now that she knows when to take them to improve their effectiveness. She would like to have the option of taking a correction dose of insulin when her BG's are elevated, which she plans to discuss with her MD. She is doing well with Carb Counting and averaging 3  Carb Choices at each meal. Plan: Continue to aim for 3 Carb Choices (45 grams) per meal +/- 1 either way Aim for 0-1 Carb Choices per snack if hungry Continue checking BG 4-6 times per day as directed by MD, alternating between pre and post meal times Continue to read food labels for total carbohydrate of foods Continue walking every morning and evening as able for 30 minutes Consider concept of Intensive Insulin therapy if OK with MD to be able to correct for hyperglycemia  Monitoring/Evaluation:  Dietary intake, exercise, medication schedule, and body weight in 8 week(s).

## 2011-09-18 ENCOUNTER — Encounter: Payer: Self-pay | Admitting: *Deleted

## 2011-09-18 ENCOUNTER — Encounter: Payer: Medicare Other | Attending: Family Medicine | Admitting: *Deleted

## 2011-09-18 DIAGNOSIS — E669 Obesity, unspecified: Secondary | ICD-10-CM | POA: Insufficient documentation

## 2011-09-18 DIAGNOSIS — Z713 Dietary counseling and surveillance: Secondary | ICD-10-CM | POA: Insufficient documentation

## 2011-09-18 DIAGNOSIS — E119 Type 2 diabetes mellitus without complications: Secondary | ICD-10-CM | POA: Insufficient documentation

## 2011-09-18 NOTE — Progress Notes (Signed)
Medical Nutrition Therapy:  Appt start time: 1230 end time:  1300.  Assessment:  Primary concerns today: patient referred back to [facility] for insulin instruction for correction dosing when BG too hight. . No insulin orders received and patient taking Lantus successfully @ 50 units at night in addition to oral medications for diabetes listed below. She states she is testing her BG pre-meal and at bedtime with FBG range 75-111 mg/dl and post meal BG below 160 mg/dl so again, no correction insulin dose needed after all. She states her BG's are much improved from last visit now that she knows what times to take her medications. She continues to walk regularly and is doing well with 3 carb choices per meal. She declined being weighed today, stating her regularly planned follow up appointment here is in September. She did request assistance with setting the correct date and time on her meter.  MEDICATIONS: see list. Diabetes medications include Actos, Amaryl, Metformin and Lantus   DIETARY INTAKE:  Usual eating pattern includes 3 meals and 1-2 snacks per day.  Everyday foods include good variety of all food groups.  Avoided foods include typically high fat or high sugar foods.    24-hr recall:  B ( AM): 2 slices [place] bacon, 1 egg, cheese, 2 slices toast and occasionally grits OR pancake with eggs and bacon, 2% milk  Snk ( AM): occasionally a banana or other fresh fruit  L ( PM): salad OR sweet potato with meat, with diet Mountain Dew Snk ( PM): none D ( PM): fish sandwich, diet soda OR uses Plate Method with lean meat, starch and vegetables, water Snk ( PM): after walk: cheese and fresh fruit OR graham crackers Beverages: water, diet soda  Usual physical activity: walk every evening for about 30 minutes, occasional walk in stores, play WII with son  Estimated energy needs: 1500 calories 170 g carbohydrates 112 g protein 42 g fat  Progress Towards Goal(s):  In progress.   Nutritional  Diagnosis:  NI-1.5 Excessive energy intake As related to activity level.     Intervention: Patient states her BG's are now in good control and no need for correction dose at this time. She brought her [employer] meter and I was able to set the correct time and date per her request. Plan: Continue to aim for 3 Carb Choices (45 grams) per meal +/- 1 either way Aim for 0-1 Carb Choices per snack if hungry Continue checking BG 4-6 times per day as directed by MD, alternating between pre and post meal times Continue to read food labels for total carbohydrate of foods Continue walking every morning and evening as able for 30 minutes  Monitoring/Evaluation:  Dietary intake, exercise, medication schedule, and body weight in 6 week(s).

## 2011-10-17 ENCOUNTER — Ambulatory Visit (INDEPENDENT_AMBULATORY_CARE_PROVIDER_SITE_OTHER): Payer: Medicare Other | Admitting: Ophthalmology

## 2011-10-18 ENCOUNTER — Ambulatory Visit (INDEPENDENT_AMBULATORY_CARE_PROVIDER_SITE_OTHER): Payer: Medicare Other | Admitting: Ophthalmology

## 2011-10-26 ENCOUNTER — Ambulatory Visit (INDEPENDENT_AMBULATORY_CARE_PROVIDER_SITE_OTHER): Payer: Medicare Other | Admitting: Ophthalmology

## 2011-10-26 DIAGNOSIS — E1039 Type 1 diabetes mellitus with other diabetic ophthalmic complication: Secondary | ICD-10-CM

## 2011-10-26 DIAGNOSIS — H35039 Hypertensive retinopathy, unspecified eye: Secondary | ICD-10-CM

## 2011-10-26 DIAGNOSIS — H43819 Vitreous degeneration, unspecified eye: Secondary | ICD-10-CM

## 2011-10-26 DIAGNOSIS — I10 Essential (primary) hypertension: Secondary | ICD-10-CM

## 2011-10-26 DIAGNOSIS — E11319 Type 2 diabetes mellitus with unspecified diabetic retinopathy without macular edema: Secondary | ICD-10-CM

## 2011-10-30 ENCOUNTER — Ambulatory Visit: Payer: Medicare Other | Admitting: *Deleted

## 2011-11-24 ENCOUNTER — Encounter (HOSPITAL_BASED_OUTPATIENT_CLINIC_OR_DEPARTMENT_OTHER): Payer: Medicare Other | Attending: General Surgery

## 2011-11-24 DIAGNOSIS — L97809 Non-pressure chronic ulcer of other part of unspecified lower leg with unspecified severity: Secondary | ICD-10-CM | POA: Insufficient documentation

## 2011-11-24 DIAGNOSIS — I872 Venous insufficiency (chronic) (peripheral): Secondary | ICD-10-CM | POA: Insufficient documentation

## 2011-12-22 ENCOUNTER — Encounter (HOSPITAL_BASED_OUTPATIENT_CLINIC_OR_DEPARTMENT_OTHER): Payer: Medicare Other

## 2012-04-24 ENCOUNTER — Ambulatory Visit (INDEPENDENT_AMBULATORY_CARE_PROVIDER_SITE_OTHER): Payer: Medicare Other | Admitting: Ophthalmology

## 2012-04-24 DIAGNOSIS — E11319 Type 2 diabetes mellitus with unspecified diabetic retinopathy without macular edema: Secondary | ICD-10-CM

## 2012-04-24 DIAGNOSIS — H43819 Vitreous degeneration, unspecified eye: Secondary | ICD-10-CM

## 2012-04-24 DIAGNOSIS — I10 Essential (primary) hypertension: Secondary | ICD-10-CM

## 2012-04-24 DIAGNOSIS — H35039 Hypertensive retinopathy, unspecified eye: Secondary | ICD-10-CM

## 2012-04-24 DIAGNOSIS — E1165 Type 2 diabetes mellitus with hyperglycemia: Secondary | ICD-10-CM

## 2012-05-02 ENCOUNTER — Other Ambulatory Visit: Payer: Self-pay | Admitting: Internal Medicine

## 2012-05-02 DIAGNOSIS — R1013 Epigastric pain: Secondary | ICD-10-CM

## 2012-05-03 ENCOUNTER — Ambulatory Visit
Admission: RE | Admit: 2012-05-03 | Discharge: 2012-05-03 | Disposition: A | Discharge status: Home / Self Care | Payer: Medicare Other | Source: Ambulatory Visit | Attending: Internal Medicine | Admitting: Internal Medicine

## 2012-05-03 DIAGNOSIS — R1013 Epigastric pain: Secondary | ICD-10-CM

## 2012-05-07 ENCOUNTER — Encounter (HOSPITAL_COMMUNITY): Payer: Self-pay | Admitting: Emergency Medicine

## 2012-05-07 ENCOUNTER — Emergency Department (HOSPITAL_COMMUNITY)
Admission: EM | Admit: 2012-05-07 | Discharge: 2012-05-08 | Disposition: A | Discharge status: Home / Self Care | Payer: Medicare Other | Attending: Emergency Medicine | Admitting: Emergency Medicine

## 2012-05-07 DIAGNOSIS — Z794 Long term (current) use of insulin: Secondary | ICD-10-CM | POA: Insufficient documentation

## 2012-05-07 DIAGNOSIS — E7800 Pure hypercholesterolemia, unspecified: Secondary | ICD-10-CM | POA: Insufficient documentation

## 2012-05-07 DIAGNOSIS — K3189 Other diseases of stomach and duodenum: Secondary | ICD-10-CM | POA: Insufficient documentation

## 2012-05-07 DIAGNOSIS — M1710 Unilateral primary osteoarthritis, unspecified knee: Secondary | ICD-10-CM | POA: Insufficient documentation

## 2012-05-07 DIAGNOSIS — Z79899 Other long term (current) drug therapy: Secondary | ICD-10-CM | POA: Insufficient documentation

## 2012-05-07 DIAGNOSIS — Z862 Personal history of diseases of the blood and blood-forming organs and certain disorders involving the immune mechanism: Secondary | ICD-10-CM | POA: Insufficient documentation

## 2012-05-07 DIAGNOSIS — R52 Pain, unspecified: Secondary | ICD-10-CM | POA: Insufficient documentation

## 2012-05-07 DIAGNOSIS — R1013 Epigastric pain: Secondary | ICD-10-CM | POA: Insufficient documentation

## 2012-05-07 DIAGNOSIS — Z87891 Personal history of nicotine dependence: Secondary | ICD-10-CM | POA: Insufficient documentation

## 2012-05-07 DIAGNOSIS — IMO0002 Reserved for concepts with insufficient information to code with codable children: Secondary | ICD-10-CM | POA: Insufficient documentation

## 2012-05-07 DIAGNOSIS — I10 Essential (primary) hypertension: Secondary | ICD-10-CM | POA: Insufficient documentation

## 2012-05-07 DIAGNOSIS — E119 Type 2 diabetes mellitus without complications: Secondary | ICD-10-CM | POA: Insufficient documentation

## 2012-05-07 DIAGNOSIS — K219 Gastro-esophageal reflux disease without esophagitis: Secondary | ICD-10-CM | POA: Insufficient documentation

## 2012-05-07 DIAGNOSIS — M2550 Pain in unspecified joint: Secondary | ICD-10-CM

## 2012-05-07 DIAGNOSIS — R197 Diarrhea, unspecified: Secondary | ICD-10-CM | POA: Insufficient documentation

## 2012-05-07 HISTORY — DX: Obesity, unspecified: E66.9

## 2012-05-07 LAB — COMPREHENSIVE METABOLIC PANEL
Albumin: 3.8 g/dL (ref 3.5–5.2)
BUN: 31 mg/dL — ABNORMAL HIGH (ref 6–23)
Calcium: 11 mg/dL — ABNORMAL HIGH (ref 8.4–10.5)
Chloride: 98 mEq/L (ref 96–112)
Creatinine, Ser: 1.55 mg/dL — ABNORMAL HIGH (ref 0.50–1.10)
GFR calc non Af Amer: 36 mL/min — ABNORMAL LOW (ref >90)
Total Bilirubin: 0.2 mg/dL — ABNORMAL LOW (ref 0.3–1.2)

## 2012-05-07 LAB — URINALYSIS, ROUTINE W REFLEX MICROSCOPIC
Glucose, UA: >1000 mg/dL — AB
Leukocytes, UA: NEGATIVE
Protein, ur: NEGATIVE mg/dL
Specific Gravity, Urine: 1.03 (ref 1.005–1.030)
Urobilinogen, UA: 0.2 mg/dL (ref 0.0–1.0)

## 2012-05-07 LAB — CBC WITH DIFFERENTIAL/PLATELET
Basophils Relative: 0 % (ref 0–1)
Eosinophils Relative: 0 % (ref 0–5)
HCT: 40.5 % (ref 36.0–46.0)
Hemoglobin: 12.9 g/dL (ref 12.0–15.0)
MCH: 26.8 pg (ref 26.0–34.0)
MCHC: 31.9 g/dL (ref 30.0–36.0)
MCV: 84.2 fL (ref 78.0–100.0)
Monocytes Absolute: 0.8 10*3/uL (ref 0.1–1.0)
Monocytes Relative: 10 % (ref 3–12)
Neutro Abs: 4.6 10*3/uL (ref 1.7–7.7)

## 2012-05-07 LAB — URINE MICROSCOPIC-ADD ON

## 2012-05-07 MED ORDER — ONDANSETRON HCL 4 MG/2ML IJ SOLN
4.0000 mg | Freq: Once | INTRAMUSCULAR | Status: AC
  Administered 2012-05-07: 4 mg via INTRAVENOUS
  Filled 2012-05-07: qty 2

## 2012-05-07 MED ORDER — SODIUM CHLORIDE 0.9 % IV BOLUS (SEPSIS)
500.0000 mL | Freq: Once | INTRAVENOUS | Status: AC
  Administered 2012-05-07: 500 mL via INTRAVENOUS

## 2012-05-07 NOTE — ED Provider Notes (Signed)
History     CSN: [id number]  Arrival date & time 05/07/12  2033   First MD Initiated Contact with Patient 05/07/12 2132      Chief Complaint  Patient presents with  . Emesis  . Diarrhea     HPI anasea and vomiting associated with diarrhea for the last several weeks.  No blood in stool or emesis.  Hx of DM.  Alos chronic arthritic pain.  Has been using NSAIDS for pain.  Denies fever.   Past Medical History  Diagnosis Date  . Diabetes mellitus   . Hypertension   . Hypercholesterolemia   . Arthritis of knee   . Anemia 08/02/2011  . Obesity     Past Surgical History  Procedure Laterality Date  . Tubaligation  1981  . Ankle arthroscopy  2007    right    Family History  Problem Relation Age of Onset  . Diabetes type II Father     History  Substance Use Topics  . Smoking status: Former Smoker    Quit date: 02/14/1996  . Smokeless tobacco: Never Used  . Alcohol Use: No    OB History   Grav Para Term Preterm Abortions TAB SAB Ect Mult Living                  Review of Systems  Constitutional: Positive for fatigue. Negative for fever and unexpected weight change.  Respiratory: Negative for chest tightness.   Endocrine: Positive for heat intolerance and polydipsia. Negative for cold intolerance.  All other systems reviewed and are negative.    Allergies  Latex  Home Medications   Current Outpatient Rx  Name  Route  Sig  Dispense  Refill  . amLODipine (NORVASC) 5 MG tablet   Oral   Take 5 mg by mouth daily.         Marland Kitchen esomeprazole (NEXIUM) 40 MG capsule   Oral   Take 40 mg by mouth daily before breakfast.         . glimepiride (AMARYL) 4 MG tablet   Oral   Take 4 mg by mouth 2 (two) times daily.         . insulin detemir (LEVEMIR) 100 UNIT/ML injection   Subcutaneous   Inject 50 Units into the skin at bedtime.         Marland Kitchen lisinopril (PRINIVIL,ZESTRIL) 10 MG tablet   Oral   Take 10 mg by mouth daily.         . meloxicam (MOBIC) 7.5 MG  tablet   Oral   Take 7.5 mg by mouth 2 (two) times daily.         . metFORMIN (GLUCOPHAGE) 1000 MG tablet   Oral   Take 1,000 mg by mouth 2 (two) times daily with a meal.         . naproxen sodium (ANAPROX) 220 MG tablet   Oral   Take 440 mg by mouth 2 (two) times daily as needed (for pain).         Marland Kitchen omeprazole (PRILOSEC) 20 MG capsule   Oral   Take 20 mg by mouth daily.         . pioglitazone (ACTOS) 30 MG tablet   Oral   Take 30 mg by mouth daily.         . rosuvastatin (CRESTOR) 5 MG tablet   Oral   Take 5 mg by mouth daily.         Marland Kitchen tolterodine (DETROL LA) 2  MG 24 hr capsule   Oral   Take 2 mg by mouth daily.           BP 106/72  Pulse 111  Temp(Src) 98.8 F (37.1 C) (Oral)  Resp 14  SpO2 98%  Physical Exam  Nursing note and vitals reviewed. Constitutional: She is oriented to person, place, and time. She appears well-developed and well-nourished. No distress.  HENT:  Head: Normocephalic and atraumatic.  Eyes: Pupils are equal, round, and reactive to light.  Neck: Normal range of motion.  Cardiovascular: Intact distal pulses.  Tachycardia present.   Pulmonary/Chest: No respiratory distress.  Abdominal: Normal appearance. She exhibits no distension.  Musculoskeletal: Normal range of motion.  Neurological: She is alert and oriented to person, place, and time. No cranial nerve deficit.  Skin: Skin is warm and dry. No rash noted.  Psychiatric: She has a normal mood and affect. Her behavior is normal.    ED Course  Procedures (including critical care time) Medications  sodium chloride 0.9 % bolus 500 mL (0 mLs Intravenous Stopped 05/07/12 2300)  ondansetron (ZOFRAN) injection 4 mg (4 mg Intravenous Given 05/07/12 2153)  sodium chloride 0.9 % bolus 500 mL (0 mLs Intravenous Stopped 05/07/12 2359)  sodium chloride 0.9 % bolus 1,000 mL (0 mLs Intravenous Stopped 05/08/12 0226)    Labs Reviewed  COMPREHENSIVE METABOLIC PANEL - Abnormal; Notable for  the following:    Glucose, Bld 191 (*)    BUN 31 (*)    Creatinine, Ser 1.55 (*)    Calcium 11.0 (*)    Total Bilirubin 0.2 (*)    GFR calc non Af Amer 36 (*)    GFR calc Af Amer 42 (*)    All other components within normal limits  URINALYSIS, ROUTINE W REFLEX MICROSCOPIC - Abnormal; Notable for the following:    APPearance CLOUDY (*)    Glucose, UA >1000 (*)    Bilirubin Urine SMALL (*)    Ketones, ur 15 (*)    All other components within normal limits  URINE MICROSCOPIC-ADD ON - Abnormal; Notable for the following:    Squamous Epithelial / LPF FEW (*)    All other components within normal limits  POCT I-STAT, CHEM 8 - Abnormal; Notable for the following:    BUN 32 (*)    Creatinine, Ser 1.30 (*)    Glucose, Bld 175 (*)    Calcium, Ion 1.24 (*)    Hemoglobin 11.9 (*)    HCT 35.0 (*)    All other components within normal limits  URINE CULTURE  CBC WITH DIFFERENTIAL  TSH  PRO B NATRIURETIC PEPTIDE   No results found.   1. Dyspepsia   2. Hypercalcemia   3. Arthritic-like pain       MDM          Nelia Shi, MD 05/08/12 [phone]

## 2012-05-07 NOTE — ED Notes (Signed)
PT. REPORTS NAUSEA , VOMITTING AND DIARRHEA WITH BODY ACHES FOR SEVERAL WEEKS .

## 2012-05-08 DIAGNOSIS — K3189 Other diseases of stomach and duodenum: Secondary | ICD-10-CM

## 2012-05-08 LAB — POCT I-STAT, CHEM 8
BUN: 32 mg/dL — ABNORMAL HIGH (ref 6–23)
Chloride: 105 mEq/L (ref 96–112)
Creatinine, Ser: 1.3 mg/dL — ABNORMAL HIGH (ref 0.50–1.10)
Glucose, Bld: 175 mg/dL — ABNORMAL HIGH (ref 70–99)
Potassium: 4.2 mEq/L (ref 3.5–5.1)

## 2012-05-08 LAB — PRO B NATRIURETIC PEPTIDE: Pro B Natriuretic peptide (BNP): 24.3 pg/mL (ref 0–125)

## 2012-05-08 LAB — URINE CULTURE
Colony Count: NO GROWTH
Culture: NO GROWTH

## 2012-05-08 MED ORDER — SODIUM CHLORIDE 0.9 % IV BOLUS (SEPSIS)
1000.0000 mL | Freq: Once | INTRAVENOUS | Status: AC
  Administered 2012-05-08: 1000 mL via INTRAVENOUS

## 2012-05-08 NOTE — ED Provider Notes (Signed)
Discussed pt with Dr Joneen Roach.  Declines to admit pt to hospital.  States had difficulty with discharge instructions, so discharge order are per her, and as curtesy    Rosanne Ashing, MD 05/08/12 0246

## 2012-05-08 NOTE — Consult Note (Signed)
PCP:   Georganna Skeans, MD   Chief Complaint:  nausea  HPI: This is a 57 year old female who has a worsening GERD, patient is morbidly obese. Patient states that she lays down she feels her heartburn reflux and injured throat. Today she developed nausea and vomited once. She felt discomfort all day, she had myalgia. She denies any fever, burning urination, cough, wheezing, shortness of breath,, diarrhea. She came to the ER. In the ER she is receiving antiemetics, she states her nausea is resolved. She has received 1 L of fluids. The hospitalist service was asked to admit because the patient's calcium is 11, normal is 10.5. Patient's creatinine is elevated to 1.55 and her heart rate is elevated between 100 and 120 and her hemoglobin normally is 10.5, today's 12.  Review of Systems:  The patient denies anorexia, fever, weight loss,, vision loss, decreased hearing, hoarseness, chest pain, syncope, dyspnea on exertion, peripheral edema, balance deficits, hemoptysis, abdominal pain, melena, hematochezia, severe indigestion/heartburn, hematuria, incontinence, genital sores, muscle weakness, suspicious skin lesions, transient blindness, difficulty walking, depression, unusual weight change, abnormal bleeding, enlarged lymph nodes, angioedema, and breast masses.  Past Medical History: Past Medical History  Diagnosis Date  . Diabetes mellitus   . Hypertension   . Hypercholesterolemia   . Arthritis of knee   . Anemia 08/02/2011  . Obesity    Past Surgical History  Procedure Laterality Date  . Tubaligation  1981  . Ankle arthroscopy  2007    right    Medications: Prior to Admission medications   Medication Sig Start Date End Date Taking? Authorizing Provider  amLODipine (NORVASC) 5 MG tablet Take 5 mg by mouth daily. 08/03/11 08/02/12 Yes Rodolph Bong, MD  esomeprazole (NEXIUM) 40 MG capsule Take 40 mg by mouth daily before breakfast.   Yes Historical Provider, MD  glimepiride (AMARYL) 4 MG  tablet Take 4 mg by mouth 2 (two) times daily.   Yes Historical Provider, MD  insulin detemir (LEVEMIR) 100 UNIT/ML injection Inject 50 Units into the skin at bedtime.   Yes Historical Provider, MD  lisinopril (PRINIVIL,ZESTRIL) 10 MG tablet Take 10 mg by mouth daily.   Yes Historical Provider, MD  meloxicam (MOBIC) 7.5 MG tablet Take 7.5 mg by mouth 2 (two) times daily.   Yes Historical Provider, MD  metFORMIN (GLUCOPHAGE) 1000 MG tablet Take 1,000 mg by mouth 2 (two) times daily with a meal.   Yes Historical Provider, MD  naproxen sodium (ANAPROX) 220 MG tablet Take 440 mg by mouth 2 (two) times daily as needed (for pain).   Yes Historical Provider, MD  omeprazole (PRILOSEC) 20 MG capsule Take 20 mg by mouth daily.   Yes Historical Provider, MD  pioglitazone (ACTOS) 30 MG tablet Take 30 mg by mouth daily.   Yes Historical Provider, MD  rosuvastatin (CRESTOR) 5 MG tablet Take 5 mg by mouth daily.   Yes Historical Provider, MD  tolterodine (DETROL LA) 2 MG 24 hr capsule Take 2 mg by mouth daily.   Yes Historical Provider, MD    Allergies:   Allergies  Allergen Reactions  . Latex     REACTION: rash    Social History:  reports that she quit smoking about 16 years ago. She has never used smokeless tobacco. She reports that she does not drink alcohol or use illicit drugs.  Family History: Family History  Problem Relation Age of Onset  . Diabetes type II Father     Physical Exam: Filed Vitals:   05/07/12 2249  05/07/12 2315 05/07/12 2345 05/08/12 0000  BP: 100/69 112/75 101/66 103/71  Pulse: 122 119 118 117  Temp:      TempSrc:      Resp: 14     SpO2: 96% 97% 98% 98%    General:  Alert and oriented times three, well developed and nourished, no acute distress Eyes: PERRLA, pink conjunctiva, no scleral icterus ENT: dry oral mucosa, neck supple, no thyromegaly Lungs: clear to ascultation, no wheeze, no crackles, no use of accessory muscles Cardiovascular: regular rate and rhythm,  no regurgitation, no gallops, no murmurs. No carotid bruits, no JVD Abdomen: soft, positive BS, non-tender, non-distended, no organomegaly, not an acute abdomen GU: not examined Neuro: CN II - XII grossly intact, sensation intact Musculoskeletal: strength 5/5 all extremities, no clubbing, cyanosis or edema Skin: no rash, no subcutaneous crepitation, no decubitus Psych: appropriate patient   Labs on Admission:   Recent Labs  05/07/12 2100  NA 137  K 4.4  CL 98  CO2 26  GLUCOSE 191*  BUN 31*  CREATININE 1.55*  CALCIUM 11.0*    Recent Labs  05/07/12 2100  AST 16  ALT 13  ALKPHOS 77  BILITOT 0.2*  PROT 7.8  ALBUMIN 3.8   No results found for this basename: LIPASE, AMYLASE,  in the last 72 hours  Recent Labs  05/07/12 2100  WBC 7.8  NEUTROABS 4.6  HGB 12.9  HCT 40.5  MCV 84.2  PLT 253    Micro Results: No results found for this or any previous visit (from the past 240 hour(s)).   Radiological Exams on Admission: No results found.  Assessment/Plan Present on Admission:  Nausea resolved  Tachycardia Patient had nausea and single episode of emesis. Since being in the ER her nausea is resolved with the medication, my recommendation is an additional liter fluid hydration and recheck labs BMP and CBC. If tachycardia is improving patient is okay to go home and continue her hydration Hypercalcemia Mild may also be due to dehydration. Recommend further hydration, this can be followed with patient's outpatient PCP GERD Recommend patient be discharged and PPIs and follow up outpatient with PCP Diabetes mellitus Hypertension Dyslipidemia Morbid obesity All stable issues  Final recommendation: all patient's symptoms can be attributed to dehydration, however, given patient's young age I believe patient could receive further hydrated in the ER then reassessed and hopefully discharged home.  Full code  Time in 12 AM Time out 2:25 AM  Chari Parmenter 05/08/2012,  12:33 AM

## 2012-05-08 NOTE — ED Notes (Signed)
Admitting MD at bedside.

## 2012-05-09 ENCOUNTER — Encounter (HOSPITAL_COMMUNITY): Payer: Self-pay | Admitting: *Deleted

## 2012-05-09 ENCOUNTER — Emergency Department (INDEPENDENT_AMBULATORY_CARE_PROVIDER_SITE_OTHER)
Admission: EM | Admit: 2012-05-09 | Discharge: 2012-05-09 | Disposition: A | Discharge status: Home / Self Care | Payer: Medicare Other | Source: Home / Self Care

## 2012-05-09 DIAGNOSIS — M25519 Pain in unspecified shoulder: Secondary | ICD-10-CM

## 2012-05-09 DIAGNOSIS — M25511 Pain in right shoulder: Secondary | ICD-10-CM

## 2012-05-09 DIAGNOSIS — M67919 Unspecified disorder of synovium and tendon, unspecified shoulder: Secondary | ICD-10-CM

## 2012-05-09 MED ORDER — NAPROXEN 375 MG PO TABS: 375.0000 mg | ORAL_TABLET | Freq: Two times a day (BID) | ORAL | Status: DC

## 2012-05-09 MED ORDER — HYDROCODONE-ACETAMINOPHEN 5-325 MG PO TABS: 1.0000 | ORAL_TABLET | ORAL | Status: DC | PRN

## 2012-05-09 NOTE — ED Provider Notes (Signed)
History     CSN: [id number]  Arrival date & time 05/09/12  1032   None     Chief Complaint  Patient presents with  . Shoulder Pain    (Consider location/radiation/quality/duration/timing/severity/associated sxs/prior treatment) HPI Comments: 57 year old morbidly obese female presents with right shoulder pain. She awoke 2 days ago with pain in the right shoulder. The pain is worse with movement. She denies any known injury, repetitive movement or cause for her pain. Pain is located in the anterior shoulder over the pectoralis tendon as it inserts to the shoulder joint. She also complains of tenderness in the infraspinatus muscle and posterior shoulder joint. There is no tenderness in the superior aspect of the shoulder. Nothing seems to make it better.   Past Medical History  Diagnosis Date  . Diabetes mellitus   . Hypertension   . Hypercholesterolemia   . Arthritis of knee   . Anemia 08/02/2011  . Obesity     Past Surgical History  Procedure Laterality Date  . Tubaligation  1981  . Ankle arthroscopy  2007    right    Family History  Problem Relation Age of Onset  . Diabetes type II Father     History  Substance Use Topics  . Smoking status: Former Smoker    Quit date: 02/14/1996  . Smokeless tobacco: Never Used  . Alcohol Use: No    OB History   Grav Para Term Preterm Abortions TAB SAB Ect Mult Living                  Review of Systems  Constitutional: Negative for fever, chills and activity change.  HENT: Negative.   Respiratory: Negative.   Cardiovascular: Negative.   Musculoskeletal:       As per HPI  Skin: Negative for color change, pallor and rash.  Neurological: Negative.     Allergies  Latex  Home Medications   Current Outpatient Rx  Name  Route  Sig  Dispense  Refill  . amLODipine (NORVASC) 5 MG tablet   Oral   Take 5 mg by mouth daily.         Marland Kitchen esomeprazole (NEXIUM) 40 MG capsule   Oral   Take 40 mg by mouth daily before  breakfast.         . glimepiride (AMARYL) 4 MG tablet   Oral   Take 4 mg by mouth 2 (two) times daily.         Marland Kitchen HYDROcodone-acetaminophen (NORCO/VICODIN) 5-325 MG per tablet   Oral   Take 1 tablet by mouth every 4 (four) hours as needed for pain.   15 tablet   0   . insulin detemir (LEVEMIR) 100 UNIT/ML injection   Subcutaneous   Inject 50 Units into the skin at bedtime.         Marland Kitchen lisinopril (PRINIVIL,ZESTRIL) 10 MG tablet   Oral   Take 10 mg by mouth daily.         . meloxicam (MOBIC) 7.5 MG tablet   Oral   Take 7.5 mg by mouth 2 (two) times daily.         . metFORMIN (GLUCOPHAGE) 1000 MG tablet   Oral   Take 1,000 mg by mouth 2 (two) times daily with a meal.         . naproxen (NAPROSYN) 375 MG tablet   Oral   Take 1 tablet (375 mg total) by mouth 2 (two) times daily. Prn shoulder pain   20 tablet  0   . naproxen sodium (ANAPROX) 220 MG tablet   Oral   Take 440 mg by mouth 2 (two) times daily as needed (for pain).         Marland Kitchen omeprazole (PRILOSEC) 20 MG capsule   Oral   Take 20 mg by mouth daily.         . pioglitazone (ACTOS) 30 MG tablet   Oral   Take 30 mg by mouth daily.         . rosuvastatin (CRESTOR) 5 MG tablet   Oral   Take 5 mg by mouth daily.         Marland Kitchen tolterodine (DETROL LA) 2 MG 24 hr capsule   Oral   Take 2 mg by mouth daily.           BP 112/74  Pulse 108  Temp(Src) 97.5 F (36.4 C) (Oral)  Resp 14  SpO2 100%  Physical Exam  Nursing note and vitals reviewed. Constitutional: She is oriented to person, place, and time. She appears well-developed and well-nourished. No distress.  HENT:  Head: Normocephalic and atraumatic.  Eyes: EOM are normal. Pupils are equal, round, and reactive to light.  Neck: Normal range of motion. Neck supple.  Musculoskeletal: She exhibits tenderness. She exhibits no edema.  Abduction range of motion is complete. When arm is held over the head in 180 there is pain along the anterior  tendon of the pectoralis muscle. Internal rotation produces pain in the anterior shoulder. There is no pain or tenderness over or adjacent to the a.c. joint. No tenderness along the glenoid. No apparent joint instability or bony tenderness. Distal neurovascular and motor sensory is intact. Radial pulses 2+  Lymphadenopathy:    She has no cervical adenopathy.  Neurological: She is alert and oriented to person, place, and time. No cranial nerve deficit.  Skin: Skin is warm and dry.  Psychiatric: She has a normal mood and affect.    ED Course  Procedures (including critical care time)  Labs Reviewed - No data to display No results found.   1. Right shoulder pain   2. Tendinitis of shoulder, right       MDM  Wear a sling off and on for 4 days only not any longer. Ice for the first couple days then apply heat Norco 5 mg every 4 hours when necessary pain Naprosyn 375 mg twice a day p.c. when necessary pain After 4 days gradually start moving the shoulder and range of motion without using weights. If pain persist over the next 7-10 days call your physician for appointment and followup.        Hayden Rasmussen, NP 05/09/12 1245

## 2012-05-09 NOTE — ED Notes (Signed)
NP mabe aware of wait time for patient.

## 2012-05-09 NOTE — ED Notes (Signed)
Pt reports right shoulder pain with known injury since yesterday.

## 2012-05-10 NOTE — ED Provider Notes (Signed)
Medical screening examination/treatment/procedure(s) were performed by non-physician practitioner and as supervising physician I was immediately available for consultation/collaboration.   MORENO-COLL,Parilee Hally; MD  Sherod Cisse Moreno-Coll, MD 05/10/12 0730 

## 2012-05-13 ENCOUNTER — Encounter (INDEPENDENT_AMBULATORY_CARE_PROVIDER_SITE_OTHER): Payer: Medicare Other | Admitting: Ophthalmology

## 2012-05-13 DIAGNOSIS — E11359 Type 2 diabetes mellitus with proliferative diabetic retinopathy without macular edema: Secondary | ICD-10-CM

## 2012-05-13 DIAGNOSIS — E1139 Type 2 diabetes mellitus with other diabetic ophthalmic complication: Secondary | ICD-10-CM

## 2012-05-20 ENCOUNTER — Other Ambulatory Visit: Payer: Self-pay | Admitting: Gastroenterology

## 2012-05-29 ENCOUNTER — Emergency Department (HOSPITAL_COMMUNITY)
Admission: EM | Admit: 2012-05-29 | Discharge: 2012-05-29 | Disposition: A | Discharge status: Home / Self Care | Payer: Medicare Other | Attending: Emergency Medicine | Admitting: Emergency Medicine

## 2012-05-29 ENCOUNTER — Encounter (HOSPITAL_COMMUNITY): Payer: Self-pay | Admitting: *Deleted

## 2012-05-29 ENCOUNTER — Emergency Department (INDEPENDENT_AMBULATORY_CARE_PROVIDER_SITE_OTHER)
Admission: EM | Admit: 2012-05-29 | Discharge: 2012-05-29 | Disposition: A | Discharge status: Nursing Home (Includes ICF) | Payer: Medicare Other | Source: Home / Self Care | Attending: Family Medicine | Admitting: Family Medicine

## 2012-05-29 ENCOUNTER — Encounter (HOSPITAL_COMMUNITY): Payer: Self-pay | Admitting: Emergency Medicine

## 2012-05-29 DIAGNOSIS — E119 Type 2 diabetes mellitus without complications: Secondary | ICD-10-CM | POA: Insufficient documentation

## 2012-05-29 DIAGNOSIS — E7800 Pure hypercholesterolemia, unspecified: Secondary | ICD-10-CM | POA: Insufficient documentation

## 2012-05-29 DIAGNOSIS — Z8739 Personal history of other diseases of the musculoskeletal system and connective tissue: Secondary | ICD-10-CM | POA: Insufficient documentation

## 2012-05-29 DIAGNOSIS — E669 Obesity, unspecified: Secondary | ICD-10-CM | POA: Insufficient documentation

## 2012-05-29 DIAGNOSIS — Z862 Personal history of diseases of the blood and blood-forming organs and certain disorders involving the immune mechanism: Secondary | ICD-10-CM | POA: Insufficient documentation

## 2012-05-29 DIAGNOSIS — E860 Dehydration: Secondary | ICD-10-CM

## 2012-05-29 DIAGNOSIS — Z87891 Personal history of nicotine dependence: Secondary | ICD-10-CM | POA: Insufficient documentation

## 2012-05-29 DIAGNOSIS — R112 Nausea with vomiting, unspecified: Secondary | ICD-10-CM

## 2012-05-29 DIAGNOSIS — R1084 Generalized abdominal pain: Secondary | ICD-10-CM | POA: Insufficient documentation

## 2012-05-29 DIAGNOSIS — I10 Essential (primary) hypertension: Secondary | ICD-10-CM | POA: Insufficient documentation

## 2012-05-29 DIAGNOSIS — E871 Hypo-osmolality and hyponatremia: Secondary | ICD-10-CM | POA: Insufficient documentation

## 2012-05-29 DIAGNOSIS — R110 Nausea: Secondary | ICD-10-CM

## 2012-05-29 DIAGNOSIS — Z79899 Other long term (current) drug therapy: Secondary | ICD-10-CM | POA: Insufficient documentation

## 2012-05-29 DIAGNOSIS — N179 Acute kidney failure, unspecified: Secondary | ICD-10-CM

## 2012-05-29 DIAGNOSIS — Z794 Long term (current) use of insulin: Secondary | ICD-10-CM | POA: Insufficient documentation

## 2012-05-29 DIAGNOSIS — R109 Unspecified abdominal pain: Secondary | ICD-10-CM

## 2012-05-29 HISTORY — DX: Other specified postprocedural states: Z98.890

## 2012-05-29 LAB — COMPREHENSIVE METABOLIC PANEL
ALT: 17 U/L (ref 0–35)
AST: 19 U/L (ref 0–37)
CO2: 29 mEq/L (ref 19–32)
Chloride: 101 mEq/L (ref 96–112)
Creatinine, Ser: 1.89 mg/dL — ABNORMAL HIGH (ref 0.50–1.10)
GFR calc non Af Amer: 29 mL/min — ABNORMAL LOW (ref >90)
Sodium: 141 mEq/L (ref 135–145)
Total Bilirubin: 0.2 mg/dL — ABNORMAL LOW (ref 0.3–1.2)

## 2012-05-29 LAB — POCT I-STAT, CHEM 8
BUN: 49 mg/dL — ABNORMAL HIGH (ref 6–23)
Calcium, Ion: 1.16 mmol/L (ref 1.12–1.23)
Chloride: 105 mEq/L (ref 96–112)
Creatinine, Ser: 1.9 mg/dL — ABNORMAL HIGH (ref 0.50–1.10)
Glucose, Bld: 194 mg/dL — ABNORMAL HIGH (ref 70–99)
HCT: 39 % (ref 36.0–46.0)
Hemoglobin: 13.3 g/dL (ref 12.0–15.0)
Potassium: 3.6 mEq/L (ref 3.5–5.1)
Sodium: 143 mEq/L (ref 135–145)
TCO2: 27 mmol/L (ref 0–100)

## 2012-05-29 LAB — BASIC METABOLIC PANEL
BUN: 51 mg/dL — ABNORMAL HIGH (ref 6–23)
CO2: 27 mEq/L (ref 19–32)
Chloride: 102 mEq/L (ref 96–112)
GFR calc Af Amer: 30 mL/min — ABNORMAL LOW (ref >90)
Glucose, Bld: 193 mg/dL — ABNORMAL HIGH (ref 70–99)
Potassium: 3.7 mEq/L (ref 3.5–5.1)

## 2012-05-29 LAB — CBC WITH DIFFERENTIAL/PLATELET
Basophils Absolute: 0 10*3/uL (ref 0.0–0.1)
HCT: 35.5 % — ABNORMAL LOW (ref 36.0–46.0)
Lymphocytes Relative: 36 % (ref 12–46)
Monocytes Absolute: 0.5 10*3/uL (ref 0.1–1.0)
Neutro Abs: 2.7 10*3/uL (ref 1.7–7.7)
RBC: 4.32 MIL/uL (ref 3.87–5.11)
RDW: 14.7 % (ref 11.5–15.5)
WBC: 5 10*3/uL (ref 4.0–10.5)

## 2012-05-29 LAB — POCT URINALYSIS DIP (DEVICE)
Glucose, UA: 500 mg/dL — AB
Leukocytes, UA: NEGATIVE
Nitrite: NEGATIVE
Protein, ur: 30 mg/dL — AB
Specific Gravity, Urine: 1.025 (ref 1.005–1.030)
Urobilinogen, UA: 1 mg/dL (ref 0.0–1.0)
pH: 5 (ref 5.0–8.0)

## 2012-05-29 MED ORDER — METOCLOPRAMIDE HCL 5 MG PO TABS: 10.0000 mg | ORAL_TABLET | Freq: Three times a day (TID) | ORAL | Status: DC

## 2012-05-29 MED ORDER — SODIUM CHLORIDE 0.9 % IV BOLUS (SEPSIS)
1000.0000 mL | Freq: Once | INTRAVENOUS | Status: AC
  Administered 2012-05-29: 1000 mL via INTRAVENOUS

## 2012-05-29 MED ORDER — ONDANSETRON HCL 4 MG/2ML IJ SOLN
4.0000 mg | Freq: Once | INTRAMUSCULAR | Status: AC
  Administered 2012-05-29: 4 mg via INTRAVENOUS
  Filled 2012-05-29: qty 2

## 2012-05-29 NOTE — ED Notes (Signed)
Pt  Reports   Continuing  Symptoms  Of  Vomiting  /  Diarrhea   Which  She  Reports  Has  Been  For   1  Month         She  Reports      She  Has  Had  Several  Visits  For  Similar  Complaints            She  Reports    Vomited yest  No  Diarrhea  For  3  Days    She  Is  Sitting  Upright  On  Exam table  In no  Distress  Speaking in  Complete  sentances  And  Is  In no  Distress

## 2012-05-29 NOTE — ED Provider Notes (Signed)
History     CSN: [id number]  Arrival date & time 05/29/12  1024   First MD Initiated Contact with Patient 05/29/12 1028      Chief Complaint  Patient presents with  . Emesis    (Consider location/radiation/quality/duration/timing/severity/associated sxs/prior treatment) HPI Comments: 57 year old female with history of diabetes and gastro esophageal reflux with frequent episodes of epigastric pain with nausea and vomiting during the last month. GI upper endoscopy on 4/7 negative for malignancy of the gastric mucosa, positive for inflammation and edema. Patient is here today complaining of nausea vomiting exacerbation during the last week. Vomited 3 times last night (non bloody emesis) has been unable to eat solids or keeps fluid down in last 3 days. Denies diarrhea or melena. Reports epigastric pain 7/10. She is seen a GI specialist has been taken zegerid and  Sucralfate. Also ran out Reglan for nausea. No taking any pain medications. Reports her diabetes control has improved in last month although currently stopped diabetes medications in last few days due to nausea and vomiting. Has follow up appt with her PCP in 2 days. No fever or chills no dysuria.    Past Medical History  Diagnosis Date  . Diabetes mellitus   . Hypertension   . Hypercholesterolemia   . Arthritis of knee   . Anemia 08/02/2011  . Obesity     Past Surgical History  Procedure Laterality Date  . Tubaligation  1981  . Ankle arthroscopy  2007    right    Family History  Problem Relation Age of Onset  . Diabetes type II Father     History  Substance Use Topics  . Smoking status: Former Smoker    Quit date: 02/14/1996  . Smokeless tobacco: Never Used  . Alcohol Use: No    OB History   Grav Para Term Preterm Abortions TAB SAB Ect Mult Living                  Review of Systems  Constitutional: Positive for appetite change and fatigue. Negative for fever, chills and diaphoresis.  HENT: Negative for  congestion.   Respiratory: Negative for cough and shortness of breath.   Gastrointestinal: Positive for nausea, vomiting and abdominal pain. Negative for diarrhea.  Endocrine: Negative for cold intolerance, heat intolerance, polydipsia, polyphagia and polyuria.  All other systems reviewed and are negative.    Allergies  Latex  Home Medications   Current Outpatient Rx  Name  Route  Sig  Dispense  Refill  . amLODipine (NORVASC) 5 MG tablet   Oral   Take 5 mg by mouth daily.         Marland Kitchen esomeprazole (NEXIUM) 40 MG capsule   Oral   Take 40 mg by mouth daily before breakfast.         . glimepiride (AMARYL) 4 MG tablet   Oral   Take 4 mg by mouth 2 (two) times daily.         Marland Kitchen HYDROcodone-acetaminophen (NORCO/VICODIN) 5-325 MG per tablet   Oral   Take 1 tablet by mouth every 4 (four) hours as needed for pain.   15 tablet   0   . insulin detemir (LEVEMIR) 100 UNIT/ML injection   Subcutaneous   Inject 50 Units into the skin at bedtime.         Marland Kitchen lisinopril (PRINIVIL,ZESTRIL) 10 MG tablet   Oral   Take 10 mg by mouth daily.         . meloxicam (MOBIC)  7.5 MG tablet   Oral   Take 7.5 mg by mouth 2 (two) times daily.         . metFORMIN (GLUCOPHAGE) 1000 MG tablet   Oral   Take 1,000 mg by mouth 2 (two) times daily with a meal.         . naproxen (NAPROSYN) 375 MG tablet   Oral   Take 1 tablet (375 mg total) by mouth 2 (two) times daily. Prn shoulder pain   20 tablet   0   . naproxen sodium (ANAPROX) 220 MG tablet   Oral   Take 440 mg by mouth 2 (two) times daily as needed (for pain).         Marland Kitchen omeprazole (PRILOSEC) 20 MG capsule   Oral   Take 20 mg by mouth daily.         . pioglitazone (ACTOS) 30 MG tablet   Oral   Take 30 mg by mouth daily.         . rosuvastatin (CRESTOR) 5 MG tablet   Oral   Take 5 mg by mouth daily.         Marland Kitchen tolterodine (DETROL LA) 2 MG 24 hr capsule   Oral   Take 2 mg by mouth daily.           BP 95/57   Pulse 113  Temp(Src) 97.6 F (36.4 C) (Oral)  Resp 18  SpO2 100%  Physical Exam  Nursing note and vitals reviewed. Constitutional: She is oriented to person, place, and time. She appears well-developed and well-nourished. No distress.  obese  HENT:  Head: Normocephalic and atraumatic.  Mouth/Throat: No oropharyngeal exudate.  Dry lips, sticky tongue  Eyes: Conjunctivae and EOM are normal. Pupils are equal, round, and reactive to light. No scleral icterus.  Neck: Neck supple. No JVD present. No thyromegaly present.  Cardiovascular:  tachyrrhythmic   Pulmonary/Chest: Breath sounds normal. No respiratory distress. She has no wheezes. She has no rales. She exhibits no tenderness.  Abdominal: Soft. She exhibits no mass. There is tenderness. There is no rebound and no guarding.  Epigastric tenderness to deep palpation.  Lymphadenopathy:    She has no cervical adenopathy.  Neurological: She is alert and oriented to person, place, and time.  Skin: No rash noted. She is not diaphoretic.    ED Course  Procedures (including critical care time)  Labs Reviewed  POCT URINALYSIS DIP (DEVICE) - Abnormal; Notable for the following:    Glucose, UA 500 (*)    Bilirubin Urine MODERATE (*)    Ketones, ur TRACE (*)    Hgb urine dipstick TRACE (*)    Protein, ur 30 (*)    All other components within normal limits  POCT I-STAT, CHEM 8 - Abnormal; Notable for the following:    BUN 49 (*)    Creatinine, Ser 1.90 (*)    Glucose, Bld 194 (*)    All other components within normal limits  LIPASE, BLOOD  BASIC METABOLIC PANEL   No results found.   1. Dehydration   2. Acute renal failure   3. Nausea & vomiting       MDM  57 year old female with history of diabetes, GERD. Here complaining of nausea vomiting exacerbation during the last week.no fluids or solids intake since yesterday. On exam: Alert and oriented, tachycardic with a heart rate in the 110s, slightly hypotensive with a blood  pressure 95/57. Diffuse abdominal tenderness worse in the epigastric area. Blood electrolytes stable  but worsening creatinine 1.9 today compare with discharge creatinine at 1.3 on last hospital visit. Point-of-care urine with glucosuria and trace ketones. Impress acute renal failure secondary to dehydration. My impression is that patient requires IV fluids and once her creatinine improves and she can prove oral hydration, she can probably continue hydration at home and continue close followup with her PCP next Friday she is also being followed by GI specialist. I decided to transfer patient to the emergency department for further management and evaluation.         Sharin Grave, MD 05/29/12 1151

## 2012-05-29 NOTE — ED Notes (Signed)
Pt states that Dr. Randa Evens told her she has some sort of blockage in her stomach, but it is not cancerous.

## 2012-05-29 NOTE — ED Notes (Signed)
Pt given saltine crackers. 

## 2012-05-29 NOTE — ED Notes (Signed)
Family at bedside. 

## 2012-05-29 NOTE — ED Provider Notes (Signed)
History     CSN: [id number]  Arrival date & time 05/29/12  1155   First MD Initiated Contact with Patient 05/29/12 1218      Chief Complaint  Patient presents with  . Emesis    (Consider location/radiation/quality/duration/timing/severity/associated sxs/prior treatment) Patient is a 57 y.o. female presenting with vomiting. The history is provided by the patient.  Emesis Severity:  Moderate Associated symptoms: abdominal pain   Associated symptoms: no myalgias   Associated symptoms comment:  She is here for uncontrolled vomiting associated with ongoing symptoms of nausea and abdominal pain since the beginning of March 2014. She has had multiple imaging studies, EGD and medications. Evaluation continues with her primary care doctor (Avbuere) and her GI doctor Randa Evens). She uses Reglan for symptom control at home but ran out. No fever. No bloody emesis. No new symptoms. She denies significant weight loss.   Past Medical History  Diagnosis Date  . Diabetes mellitus   . Hypertension   . Hypercholesterolemia   . Arthritis of knee   . Anemia 08/02/2011  . Obesity   . S/P left knee arthroscopy     Past Surgical History  Procedure Laterality Date  . Tubaligation  1981  . Endoscopy  4/7/214    Family History  Problem Relation Age of Onset  . Diabetes type II Father     History  Substance Use Topics  . Smoking status: Former Smoker    Quit date: 02/14/1996  . Smokeless tobacco: Never Used  . Alcohol Use: No    OB History   Grav Para Term Preterm Abortions TAB SAB Ect Mult Living                  Review of Systems  Constitutional: Negative for fever.  Respiratory: Negative for shortness of breath.   Cardiovascular: Negative for chest pain.  Gastrointestinal: Positive for nausea, vomiting and abdominal pain.  Genitourinary: Negative for dysuria.  Musculoskeletal: Negative for myalgias.  Psychiatric/Behavioral: Negative for confusion.    Allergies   Latex  Home Medications   Current Outpatient Rx  Name  Route  Sig  Dispense  Refill  . amLODipine (NORVASC) 5 MG tablet   Oral   Take 5 mg by mouth daily.         . Canagliflozin (INVOKANA) 100 MG TABS   Oral   Take 100 mg by mouth daily.         . Difluprednate (DUREZOL) 0.05 % EMUL   Right Eye   Place 1 drop into the right eye 2 (two) times daily.         Marland Kitchen esomeprazole (NEXIUM) 40 MG capsule   Oral   Take 40 mg by mouth daily before breakfast.         . fesoterodine (TOVIAZ) 4 MG TB24   Oral   Take 4 mg by mouth daily.         Marland Kitchen glimepiride (AMARYL) 4 MG tablet   Oral   Take 4 mg by mouth 2 (two) times daily.         . insulin detemir (LEVEMIR) 100 UNIT/ML injection   Subcutaneous   Inject 50 Units into the skin at bedtime.         Marland Kitchen lisinopril-hydrochlorothiazide (PRINZIDE,ZESTORETIC) 20-25 MG per tablet   Oral   Take 1 tablet by mouth daily.         . metFORMIN (GLUCOPHAGE) 1000 MG tablet   Oral   Take 1,000 mg by mouth 2 (two)  times daily with a meal.         . metoCLOPramide (REGLAN) 5 MG tablet   Oral   Take 5 mg by mouth 3 (three) times daily with meals.         Marland Kitchen omeprazole (PRILOSEC) 20 MG capsule   Oral   Take 20 mg by mouth daily.         Marland Kitchen omeprazole-sodium bicarbonate (ZEGERID) 40-1100 MG per capsule   Oral   Take 1 capsule by mouth daily before breakfast.         . pantoprazole (PROTONIX) 40 MG tablet   Oral   Take 40 mg by mouth daily.         . pioglitazone (ACTOS) 30 MG tablet   Oral   Take 30 mg by mouth daily.         . rosuvastatin (CRESTOR) 20 MG tablet   Oral   Take 20 mg by mouth daily.         . sucralfate (CARAFATE) 1 G tablet   Oral   Take 1 g by mouth 2 (two) times daily.         Marland Kitchen tolterodine (DETROL LA) 2 MG 24 hr capsule   Oral   Take 2 mg by mouth daily.           BP 98/48  Pulse 98  Temp(Src) 97.7 F (36.5 C) (Oral)  Resp 19  SpO2 98%  Physical Exam   Constitutional: She is oriented to person, place, and time. She appears well-developed and well-nourished.  HENT:  Head: Normocephalic.  Mouth/Throat: Oropharynx is clear and moist.  Neck: Normal range of motion. Neck supple.  Cardiovascular: Normal rate and regular rhythm.   Pulmonary/Chest: Effort normal and breath sounds normal.  Abdominal: Soft. Bowel sounds are normal. There is no rebound and no guarding.  Abdomen diffusely and mildly tender with increased tenderness in the epigastrium.  Musculoskeletal: Normal range of motion.  Neurological: She is alert and oriented to person, place, and time.  Skin: Skin is warm and dry. No rash noted.  Psychiatric: She has a normal mood and affect.    ED Course  Procedures (including critical care time)  Labs Reviewed  CBC WITH DIFFERENTIAL - Abnormal; Notable for the following:    Hemoglobin 11.7 (*)    HCT 35.5 (*)    All other components within normal limits  COMPREHENSIVE METABOLIC PANEL - Abnormal; Notable for the following:    Potassium 3.4 (*)    Glucose, Bld 170 (*)    BUN 50 (*)    Creatinine, Ser 1.89 (*)    Total Bilirubin 0.2 (*)    GFR calc non Af Amer 29 (*)    GFR calc Af Amer 33 (*)    All other components within normal limits  LIPASE, BLOOD   Results for orders placed during the hospital encounter of 05/29/12  CBC WITH DIFFERENTIAL      Result Value Range   WBC 5.0  4.0 - 10.5 K/uL   RBC 4.32  3.87 - 5.11 MIL/uL   Hemoglobin 11.7 (*) 12.0 - 15.0 g/dL   HCT 16.1 (*) 09.6 - 04.5 %   MCV 82.2  78.0 - 100.0 fL   MCH 27.1  26.0 - 34.0 pg   MCHC 33.0  30.0 - 36.0 g/dL   RDW 40.9  81.1 - 91.4 %   Platelets 247  150 - 400 K/uL   Neutrophils Relative 54  43 - 77 %  Neutro Abs 2.7  1.7 - 7.7 K/uL   Lymphocytes Relative 36  12 - 46 %   Lymphs Abs 1.8  0.7 - 4.0 K/uL   Monocytes Relative 9  3 - 12 %   Monocytes Absolute 0.5  0.1 - 1.0 K/uL   Eosinophils Relative 1  0 - 5 %   Eosinophils Absolute 0.0  0.0 - 0.7  K/uL   Basophils Relative 0  0 - 1 %   Basophils Absolute 0.0  0.0 - 0.1 K/uL  COMPREHENSIVE METABOLIC PANEL      Result Value Range   Sodium 141  135 - 145 mEq/L   Potassium 3.4 (*) 3.5 - 5.1 mEq/L   Chloride 101  96 - 112 mEq/L   CO2 29  19 - 32 mEq/L   Glucose, Bld 170 (*) 70 - 99 mg/dL   BUN 50 (*) 6 - 23 mg/dL   Creatinine, Ser 1.19 (*) 0.50 - 1.10 mg/dL   Calcium 14.7  8.4 - 82.9 mg/dL   Total Protein 7.6  6.0 - 8.3 g/dL   Albumin 3.8  3.5 - 5.2 g/dL   AST 19  0 - 37 U/L   ALT 17  0 - 35 U/L   Alkaline Phosphatase 68  39 - 117 U/L   Total Bilirubin 0.2 (*) 0.3 - 1.2 mg/dL   GFR calc non Af Amer 29 (*) >90 mL/min   GFR calc Af Amer 33 (*) >90 mL/min  LIPASE, BLOOD      Result Value Range   Lipase 50  11 - 59 U/L    No results found.   No diagnosis found. 1. Nausea and vomiting 2. Abdominal pain   MDM  She is tolerating PO fluids and crackers. Nausea is improved. Cr. Improved. Feel elevated BUN and Cr and secondary to dehydration which has been treated with IV fluids. She has appropriate community follow up with primary and GI doctors for ongoing evaluation of persistent symptoms of greater than one month. Stable for discharge        Arnoldo Hooker, PA-C 05/31/12 2118

## 2012-05-29 NOTE — ED Notes (Signed)
The pt states that she has been having emesis since April 22, 2012. The pt denies chest pain, but says she has SOB. Pt had a scope into her stomach on May 20, 2012, by Dr. Carman Ching. Pt states that it is difficult for her to keep fluid down, and that she cannot keep solid food down. Pt has also noticed black "stuff" in her vomit. Pt is having abdominal pain in the right and left upper quadrant pain.

## 2012-05-29 NOTE — ED Notes (Signed)
Correction to pain assessment: Pt is having abdominal pain in the left lower quadrant, NOT in the upper right and left.

## 2012-05-29 NOTE — ED Notes (Signed)
Pt given warm blanket.

## 2012-06-01 NOTE — ED Provider Notes (Signed)
Medical screening examination/treatment/procedure(s) were performed by non-physician practitioner and as supervising physician I was immediately available for consultation/collaboration.  Jodeci Rini T Amitai Delaughter, MD 06/01/12 0838 

## 2012-06-12 ENCOUNTER — Emergency Department (HOSPITAL_COMMUNITY)
Admission: EM | Admit: 2012-06-12 | Discharge: 2012-06-12 | Disposition: A | Discharge status: Home / Self Care | Payer: Medicare Other | Attending: Emergency Medicine | Admitting: Emergency Medicine

## 2012-06-12 ENCOUNTER — Emergency Department (HOSPITAL_COMMUNITY): Payer: Medicare Other

## 2012-06-12 ENCOUNTER — Encounter (HOSPITAL_COMMUNITY): Payer: Self-pay | Admitting: Emergency Medicine

## 2012-06-12 DIAGNOSIS — K2970 Gastritis, unspecified, without bleeding: Secondary | ICD-10-CM | POA: Insufficient documentation

## 2012-06-12 DIAGNOSIS — R112 Nausea with vomiting, unspecified: Secondary | ICD-10-CM | POA: Insufficient documentation

## 2012-06-12 DIAGNOSIS — E119 Type 2 diabetes mellitus without complications: Secondary | ICD-10-CM | POA: Insufficient documentation

## 2012-06-12 DIAGNOSIS — Z794 Long term (current) use of insulin: Secondary | ICD-10-CM | POA: Insufficient documentation

## 2012-06-12 DIAGNOSIS — K2990 Gastroduodenitis, unspecified, without bleeding: Secondary | ICD-10-CM | POA: Insufficient documentation

## 2012-06-12 DIAGNOSIS — E860 Dehydration: Secondary | ICD-10-CM | POA: Insufficient documentation

## 2012-06-12 DIAGNOSIS — Z91040 Latex allergy status: Secondary | ICD-10-CM | POA: Insufficient documentation

## 2012-06-12 DIAGNOSIS — Z8739 Personal history of other diseases of the musculoskeletal system and connective tissue: Secondary | ICD-10-CM | POA: Insufficient documentation

## 2012-06-12 DIAGNOSIS — I10 Essential (primary) hypertension: Secondary | ICD-10-CM | POA: Insufficient documentation

## 2012-06-12 DIAGNOSIS — Z862 Personal history of diseases of the blood and blood-forming organs and certain disorders involving the immune mechanism: Secondary | ICD-10-CM | POA: Insufficient documentation

## 2012-06-12 DIAGNOSIS — Z87891 Personal history of nicotine dependence: Secondary | ICD-10-CM | POA: Insufficient documentation

## 2012-06-12 DIAGNOSIS — E7800 Pure hypercholesterolemia, unspecified: Secondary | ICD-10-CM | POA: Insufficient documentation

## 2012-06-12 DIAGNOSIS — K5900 Constipation, unspecified: Secondary | ICD-10-CM | POA: Insufficient documentation

## 2012-06-12 DIAGNOSIS — Z79899 Other long term (current) drug therapy: Secondary | ICD-10-CM | POA: Insufficient documentation

## 2012-06-12 DIAGNOSIS — E669 Obesity, unspecified: Secondary | ICD-10-CM | POA: Insufficient documentation

## 2012-06-12 LAB — COMPREHENSIVE METABOLIC PANEL
ALT: 14 U/L (ref 0–35)
AST: 16 U/L (ref 0–37)
Calcium: 10.9 mg/dL — ABNORMAL HIGH (ref 8.4–10.5)
GFR calc Af Amer: 21 mL/min — ABNORMAL LOW (ref >90)
Glucose, Bld: 193 mg/dL — ABNORMAL HIGH (ref 70–99)
Sodium: 138 mEq/L (ref 135–145)
Total Protein: 7.9 g/dL (ref 6.0–8.3)

## 2012-06-12 LAB — URINALYSIS, ROUTINE W REFLEX MICROSCOPIC
Nitrite: NEGATIVE
Specific Gravity, Urine: 1.03 (ref 1.005–1.030)
pH: 5 (ref 5.0–8.0)

## 2012-06-12 LAB — LIPASE, BLOOD: Lipase: 63 U/L — ABNORMAL HIGH (ref 11–59)

## 2012-06-12 LAB — URINE MICROSCOPIC-ADD ON

## 2012-06-12 LAB — CBC WITH DIFFERENTIAL/PLATELET
Basophils Absolute: 0 10*3/uL (ref 0.0–0.1)
Eosinophils Absolute: 0 10*3/uL (ref 0.0–0.7)
Eosinophils Relative: 0 % (ref 0–5)
MCH: 27 pg (ref 26.0–34.0)
MCHC: 32.1 g/dL (ref 30.0–36.0)
MCV: 84.1 fL (ref 78.0–100.0)
Platelets: 247 10*3/uL (ref 150–400)
RDW: 14.3 % (ref 11.5–15.5)

## 2012-06-12 MED ORDER — POLYETHYLENE GLYCOL 3350 17 G PO PACK: 17.0000 g | PACK | Freq: Every day | ORAL | Status: DC

## 2012-06-12 MED ORDER — ONDANSETRON HCL 4 MG/2ML IJ SOLN
4.0000 mg | Freq: Once | INTRAMUSCULAR | Status: AC
  Administered 2012-06-12: 4 mg via INTRAVENOUS
  Filled 2012-06-12: qty 2

## 2012-06-12 MED ORDER — SODIUM CHLORIDE 0.9 % IV BOLUS (SEPSIS)
1000.0000 mL | Freq: Once | INTRAVENOUS | Status: AC
  Administered 2012-06-12: 1000 mL via INTRAVENOUS

## 2012-06-12 MED ORDER — GI COCKTAIL ~~LOC~~
30.0000 mL | Freq: Once | ORAL | Status: AC
  Administered 2012-06-12: 30 mL via ORAL
  Filled 2012-06-12: qty 30

## 2012-06-12 MED ORDER — SODIUM CHLORIDE 0.9 % IV BOLUS (SEPSIS): 1000.0000 mL | Freq: Once | INTRAVENOUS | Status: DC

## 2012-06-12 MED ORDER — ONDANSETRON HCL 4 MG/2ML IJ SOLN
4.0000 mg | Freq: Once | INTRAMUSCULAR | Status: DC
  Filled 2012-06-12: qty 2

## 2012-06-12 NOTE — ED Notes (Signed)
[id number]<AV> Expected date:<BR> Expected time:<BR> Means of arrival:<BR> Comments:<BR> abdminal pain

## 2012-06-12 NOTE — ED Notes (Signed)
Per EMS-pt states that she was diagnosed with an ulcer earlier this month. Pt experiencing abd pain, nausea, vomiting since March. C/o of same today. Denies diarrhea. NAD at this time.

## 2012-06-12 NOTE — ED Provider Notes (Signed)
History     CSN: [id number]  Arrival date & time 06/12/12  1745   First MD Initiated Contact with Patient 06/12/12 1805      Chief Complaint  Patient presents with  . Abdominal Pain  . Nausea  . Emesis    (Consider location/radiation/quality/duration/timing/severity/associated sxs/prior treatment) HPI Pt with chronic abd pain due to PUD states she had worsening L sided abd pain today which has been constant and associated with nausea. Pt states she has not had BM in several weeks despite suppositories or laxatives. No fever or chills. Pt is followed outpt by GI.  Past Medical History  Diagnosis Date  . Diabetes mellitus   . Hypertension   . Hypercholesterolemia   . Arthritis of knee   . Anemia 08/02/2011  . Obesity   . S/P left knee arthroscopy     Past Surgical History  Procedure Laterality Date  . Tubaligation  1981  . Endoscopy  4/7/214    Family History  Problem Relation Age of Onset  . Diabetes type II Father     History  Substance Use Topics  . Smoking status: Former Smoker    Quit date: 02/14/1996  . Smokeless tobacco: Never Used  . Alcohol Use: No    OB History   Grav Para Term Preterm Abortions TAB SAB Ect Mult Living                  Review of Systems  Constitutional: Negative for fever and chills.  Respiratory: Negative for shortness of breath.   Cardiovascular: Negative for chest pain.  Gastrointestinal: Positive for nausea, abdominal pain and constipation. Negative for vomiting, diarrhea and blood in stool.  Genitourinary: Negative for dysuria and flank pain.  Musculoskeletal: Negative for myalgias and back pain.  Skin: Negative for rash and wound.  Neurological: Negative for dizziness, light-headedness, numbness and headaches.  All other systems reviewed and are negative.    Allergies  Latex  Home Medications   Current Outpatient Rx  Name  Route  Sig  Dispense  Refill  . amLODipine (NORVASC) 5 MG tablet   Oral   Take 5 mg by  mouth daily.         . Canagliflozin (INVOKANA) 100 MG TABS   Oral   Take 100 mg by mouth daily.         . Difluprednate (DUREZOL) 0.05 % EMUL   Right Eye   Place 1 drop into the right eye 2 (two) times daily.         Marland Kitchen esomeprazole (NEXIUM) 40 MG capsule   Oral   Take 40 mg by mouth 2 (two) times daily.          . fesoterodine (TOVIAZ) 4 MG TB24   Oral   Take 4 mg by mouth daily.         Marland Kitchen glimepiride (AMARYL) 4 MG tablet   Oral   Take 4 mg by mouth 2 (two) times daily.         . insulin detemir (LEVEMIR) 100 UNIT/ML injection   Subcutaneous   Inject 50 Units into the skin at bedtime.         Marland Kitchen lisinopril-hydrochlorothiazide (PRINZIDE,ZESTORETIC) 20-25 MG per tablet   Oral   Take 1 tablet by mouth daily.         . metFORMIN (GLUCOPHAGE) 1000 MG tablet   Oral   Take 1,000 mg by mouth 2 (two) times daily with a meal.         .  metoCLOPramide (REGLAN) 5 MG tablet   Oral   Take 5 mg by mouth 3 (three) times daily.         . ondansetron (ZOFRAN-ODT) 4 MG disintegrating tablet   Oral   Take 4 mg by mouth every 6 (six) hours as needed for nausea.         . pioglitazone (ACTOS) 30 MG tablet   Oral   Take 30 mg by mouth daily.         . rosuvastatin (CRESTOR) 20 MG tablet   Oral   Take 20 mg by mouth daily.         . polyethylene glycol (MIRALAX / GLYCOLAX) packet   Oral   Take 17 g by mouth daily.   14 each   0     BP 92/58  Pulse 74  Temp(Src) 98.1 F (36.7 C) (Oral)  Resp 18  SpO2 100%  Physical Exam  Nursing note and vitals reviewed. Constitutional: She is oriented to person, place, and time. She appears well-developed and well-nourished. No distress.  HENT:  Head: Normocephalic and atraumatic.  Mouth/Throat: Oropharynx is clear and moist.  Eyes: EOM are normal. Pupils are equal, round, and reactive to light.  Neck: Normal range of motion. Neck supple.  Cardiovascular: Normal rate and regular rhythm.   Pulmonary/Chest:  Effort normal and breath sounds normal. No respiratory distress. She has no wheezes. She has no rales.  Abdominal: Soft. Bowel sounds are normal. She exhibits no distension and no mass. There is tenderness (TTP in epigastrum and LUQ. No rebound or guarding. ). There is no rebound and no guarding.  Musculoskeletal: Normal range of motion. She exhibits no edema and no tenderness.  Neurological: She is alert and oriented to person, place, and time.  Skin: Skin is warm and dry. No rash noted. No erythema.  Psychiatric: She has a normal mood and affect. Her behavior is normal.    ED Course  Procedures (including critical care time)  Labs Reviewed  COMPREHENSIVE METABOLIC PANEL - Abnormal; Notable for the following:    Chloride 94 (*)    Glucose, Bld 193 (*)    BUN 70 (*)    Creatinine, Ser 2.81 (*)    Calcium 10.9 (*)    GFR calc non Af Amer 18 (*)    GFR calc Af Amer 21 (*)    All other components within normal limits  URINALYSIS, ROUTINE W REFLEX MICROSCOPIC - Abnormal; Notable for the following:    Color, Urine AMBER (*)    APPearance CLOUDY (*)    Glucose, UA >1000 (*)    Bilirubin Urine MODERATE (*)    Leukocytes, UA TRACE (*)    All other components within normal limits  LIPASE, BLOOD - Abnormal; Notable for the following:    Lipase 63 (*)    All other components within normal limits  URINE MICROSCOPIC-ADD ON - Abnormal; Notable for the following:    Squamous Epithelial / LPF FEW (*)    All other components within normal limits  CBC WITH DIFFERENTIAL   Dg Abd Acute W/chest  06/12/2012  *RADIOLOGY REPORT*  Clinical Data: Abdominal pain.  Nausea.  Vomiting.  Chest pain.  ACUTE ABDOMEN SERIES (ABDOMEN 2 VIEW & CHEST 1 VIEW)  Comparison: One-view abdomen x-ray 09/28/2008.  Two-view chest x- ray 10/16/2005.  CT abdomen pelvis 08/17/2008.  Findings: Bowel gas pattern nonobstructive.  Gas within upper normal caliber sigmoid colon.  Gas and stool throughout the colon from cecum to  rectum.  Gas in several nondistended loops of small bowel.  No evidence of free air or significant air fluid levels on the erect image.  Phleboliths in the pelvis.  No visible opaque urinary tract calculi.  Degenerative changes involving the lower lumbar spine and the visualized thoracic spine.  Cardiomediastinal silhouette unremarkable.  Lungs clear. Bronchovascular markings normal.  Pulmonary vascularity normal.  No pneumothorax.  No pleural effusions.  Old healed fracture involving the left posterolateral 6th rib.  IMPRESSION: No acute abdominal or pulmonary abnormalities.   Original Report Authenticated By: Hulan Saas, M.D.      1. Gastritis   2. Dehydration   3. Constipation       MDM   Pt tolerating PO's. Improved symptoms. Advised to F/u with PMD and GI       Loren Racer, MD 06/13/12 [phone]

## 2012-06-14 DIAGNOSIS — E119 Type 2 diabetes mellitus without complications: Secondary | ICD-10-CM | POA: Insufficient documentation

## 2012-06-14 DIAGNOSIS — R112 Nausea with vomiting, unspecified: Secondary | ICD-10-CM | POA: Insufficient documentation

## 2012-06-24 DIAGNOSIS — K311 Adult hypertrophic pyloric stenosis: Secondary | ICD-10-CM | POA: Insufficient documentation

## 2012-06-25 DIAGNOSIS — B9681 Helicobacter pylori [H. pylori] as the cause of diseases classified elsewhere: Secondary | ICD-10-CM | POA: Insufficient documentation

## 2012-06-30 DIAGNOSIS — E639 Nutritional deficiency, unspecified: Secondary | ICD-10-CM | POA: Insufficient documentation

## 2012-06-30 DIAGNOSIS — K5900 Constipation, unspecified: Secondary | ICD-10-CM | POA: Insufficient documentation

## 2012-07-19 ENCOUNTER — Emergency Department (HOSPITAL_COMMUNITY)
Admission: EM | Admit: 2012-07-19 | Discharge: 2012-07-20 | Disposition: A | Discharge status: Other Acute Inpatient Hospital | Payer: Medicare Other | Attending: Emergency Medicine | Admitting: Emergency Medicine

## 2012-07-19 ENCOUNTER — Encounter (HOSPITAL_COMMUNITY): Payer: Self-pay

## 2012-07-19 ENCOUNTER — Emergency Department (HOSPITAL_COMMUNITY): Payer: Medicare Other

## 2012-07-19 DIAGNOSIS — Z9851 Tubal ligation status: Secondary | ICD-10-CM | POA: Insufficient documentation

## 2012-07-19 DIAGNOSIS — I10 Essential (primary) hypertension: Secondary | ICD-10-CM | POA: Insufficient documentation

## 2012-07-19 DIAGNOSIS — Z8619 Personal history of other infectious and parasitic diseases: Secondary | ICD-10-CM | POA: Insufficient documentation

## 2012-07-19 DIAGNOSIS — E669 Obesity, unspecified: Secondary | ICD-10-CM | POA: Insufficient documentation

## 2012-07-19 DIAGNOSIS — IMO0002 Reserved for concepts with insufficient information to code with codable children: Secondary | ICD-10-CM | POA: Insufficient documentation

## 2012-07-19 DIAGNOSIS — Z8639 Personal history of other endocrine, nutritional and metabolic disease: Secondary | ICD-10-CM | POA: Insufficient documentation

## 2012-07-19 DIAGNOSIS — R112 Nausea with vomiting, unspecified: Secondary | ICD-10-CM | POA: Insufficient documentation

## 2012-07-19 DIAGNOSIS — Z87448 Personal history of other diseases of urinary system: Secondary | ICD-10-CM | POA: Insufficient documentation

## 2012-07-19 DIAGNOSIS — E119 Type 2 diabetes mellitus without complications: Secondary | ICD-10-CM | POA: Insufficient documentation

## 2012-07-19 DIAGNOSIS — Z96659 Presence of unspecified artificial knee joint: Secondary | ICD-10-CM | POA: Insufficient documentation

## 2012-07-19 DIAGNOSIS — Z91040 Latex allergy status: Secondary | ICD-10-CM | POA: Insufficient documentation

## 2012-07-19 DIAGNOSIS — Z87891 Personal history of nicotine dependence: Secondary | ICD-10-CM | POA: Insufficient documentation

## 2012-07-19 DIAGNOSIS — Y833 Surgical operation with formation of external stoma as the cause of abnormal reaction of the patient, or of later complication, without mention of misadventure at the time of the procedure: Secondary | ICD-10-CM | POA: Insufficient documentation

## 2012-07-19 DIAGNOSIS — Z79899 Other long term (current) drug therapy: Secondary | ICD-10-CM | POA: Insufficient documentation

## 2012-07-19 DIAGNOSIS — Z8719 Personal history of other diseases of the digestive system: Secondary | ICD-10-CM | POA: Insufficient documentation

## 2012-07-19 DIAGNOSIS — M1710 Unilateral primary osteoarthritis, unspecified knee: Secondary | ICD-10-CM | POA: Insufficient documentation

## 2012-07-19 DIAGNOSIS — Z794 Long term (current) use of insulin: Secondary | ICD-10-CM | POA: Insufficient documentation

## 2012-07-19 DIAGNOSIS — K311 Adult hypertrophic pyloric stenosis: Secondary | ICD-10-CM | POA: Insufficient documentation

## 2012-07-19 DIAGNOSIS — R000 Tachycardia, unspecified: Secondary | ICD-10-CM | POA: Insufficient documentation

## 2012-07-19 DIAGNOSIS — Z862 Personal history of diseases of the blood and blood-forming organs and certain disorders involving the immune mechanism: Secondary | ICD-10-CM | POA: Insufficient documentation

## 2012-07-19 DIAGNOSIS — Z8711 Personal history of peptic ulcer disease: Secondary | ICD-10-CM | POA: Insufficient documentation

## 2012-07-19 MED ORDER — SODIUM CHLORIDE 0.9 % IV BOLUS (SEPSIS)
1000.0000 mL | Freq: Once | INTRAVENOUS | Status: AC
  Administered 2012-07-20: 1000 mL via INTRAVENOUS

## 2012-07-19 NOTE — ED Notes (Signed)
Pt presents with c/o vomiting. Pt says the vomiting started on Sunday and she said she threw up twice that day. Pt says she threw up four times on Wednesday and then twice again today. Pt was d/c from [facility] on May with a J tube. Pt had the tube placed because of stomach blockage.

## 2012-07-19 NOTE — ED Provider Notes (Signed)
History     CSN: [id number]  Arrival date & time 07/19/12  2053   First MD Initiated Contact with Patient 07/19/12 2205      Chief Complaint  Patient presents with  . Emesis    (Consider location/radiation/quality/duration/timing/severity/associated sxs/prior treatment) HPI Patient with a history of diabetes, HTN & gastric outlet obstruction with G-J tube placed presents emergency department complaining of emesis. She reports 1-3 emesis episodes every other day for the last week associated with abdominal fullness and early satiety. Emesis looks like the food supplement given. Husband states she is supposed to be fed 85 ml/hr, but she cannot tolerate it. They called Dr. Zenaida Niece at [facility] who advised to lower pump to 50mL which is still not being tolerated causing concern and arrival to there ER. More detailed history of events leading up to today are below.   Patient states that she's been suffering from nausea and vomiting since 3 months ago and was being followed by her primary care physician at which time she was diagnosed with a peptic ulcer.  Patient was later evaluated by [facility] at which time she was admitted for hyperemesis in acute renal failure.  Patient stayed in hospital from 5/2- 07/05/2012.  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  Throughout hospital patient suffered from intermittent tachycardia s(V/Q scan which was noted to be low probability for PE as well as negative cardiac biomarkers), inability to tolerate po and significant weight loss (G-J tube placement) and when patient biopsies returned showing H. pylori she was started on triple therapy for 14 days (through 5/26).  Patient reports she's had her full course of antibiotics however emesis continues.  Past Medical History  Diagnosis Date  . Diabetes mellitus   . Hypertension   . Hypercholesterolemia   . Arthritis of knee   .  Anemia 08/02/2011  . Obesity   . S/P left knee arthroscopy     Past Surgical History  Procedure Laterality Date  . Tubaligation  1981  . Endoscopy  4/7/214    Family History  Problem Relation Age of Onset  . Diabetes type II Father     History  Substance Use Topics  . Smoking status: Former Smoker    Quit date: 02/14/1996  . Smokeless tobacco: Never Used  . Alcohol Use: No    OB History   Grav Para Term Preterm Abortions TAB SAB Ect Mult Living                  Review of Systems Ten systems reviewed and are negative for acute change, except as noted in the HPI.    Allergies  Latex  Home Medications   Current Outpatient Rx  Name  Route  Sig  Dispense  Refill  . alum & mag hydroxide-simeth (MAALOX/MYLANTA) 200-200-20 MG/5ML suspension   Tube   Give 20 mLs by tube daily as needed for indigestion. OTC "Comfort Gel" generic for Mylanta         . amLODipine (NORVASC) 1 mg/mL SUSP oral suspension   Tube   Give 5 mg by tube every morning.         . Difluprednate (DUREZOL) 0.05 % EMUL   Right Eye   Place 1 drop into the right eye 2 (two) times daily.         . insulin detemir (LEVEMIR) 100 UNIT/ML injection   Subcutaneous   Inject 5 Units into the skin at bedtime.          Marland Kitchen  insulin NPH (HUMULIN N,NOVOLIN N) 100 UNIT/ML injection   Subcutaneous   Inject 18 Units into the skin every 6 (six) hours.         Marland Kitchen omeprazole (PRILOSEC) 2 mg/mL SUSP   Tube   Give 40 mg by tube daily as needed (heartburn or acid reflux).         Marland Kitchen oxyCODONE (ROXICODONE) 5 MG/5ML solution   Tube   Give 5 mg by tube 2 (two) times daily as needed for pain.          . polyethylene glycol (MIRALAX / GLYCOLAX) packet   Tube   Give 17 g by tube every morning.            BP 115/70  Pulse 107  Temp(Src) 98.8 F (37.1 C) (Oral)  Resp 17  SpO2 100%  Physical Exam  Nursing note and vitals reviewed. Constitutional: She is oriented to person, place, and time. She  appears well-developed and well-nourished. No distress.  HENT:  Head: Normocephalic and atraumatic.  Dry mucous membranes  Eyes: Conjunctivae and EOM are normal.  Neck: Normal range of motion.  Cardiovascular:  Tachycardia.  Intact distal pulses.  No pitting edema.  Pulmonary/Chest: Effort normal.  Lungs clear auscultation.  Abdominal:  Obese nontender abdomen.  GJ tube placed without surrounding erythema or drainage  Musculoskeletal: Normal range of motion.  Neurological: She is alert and oriented to person, place, and time.  Skin: Skin is warm and dry. No rash noted. She is not diaphoretic.  Psychiatric: She has a normal mood and affect. Her behavior is normal.    ED Course  Procedures (including critical care time)  Labs Reviewed  CBC WITH DIFFERENTIAL - Abnormal; Notable for the following:    RBC 3.84 (*)    Hemoglobin 10.5 (*)    HCT 34.2 (*)    RDW 16.9 (*)    All other components within normal limits  COMPREHENSIVE METABOLIC PANEL - Abnormal; Notable for the following:    Potassium 2.9 (*)    Glucose, Bld 151 (*)    Albumin 2.8 (*)    GFR calc non Af Amer 69 (*)    GFR calc Af Amer 80 (*)    All other components within normal limits  URINALYSIS, ROUTINE W REFLEX MICROSCOPIC - Abnormal; Notable for the following:    Color, Urine AMBER (*)    APPearance CLOUDY (*)    Ketones, ur 15 (*)    Protein, ur 30 (*)    Leukocytes, UA SMALL (*)    All other components within normal limits  URINE MICROSCOPIC-ADD ON - Abnormal; Notable for the following:    Squamous Epithelial / LPF MANY (*)    All other components within normal limits  LIPASE, BLOOD   Dg Abd 1 View  07/20/2012   *RADIOLOGY REPORT*  Clinical Data: Nausea, vomiting and abdominal pain.  Assess J-tube position.  ABDOMEN - 1 VIEW  Comparison: Abdominal radiograph performed 06/12/2012  Findings: The patient has a GJ tube, extending through the stomach and duodenum into the jejunum.  The balloon of the tube is noted  overlying the body of the stomach.  There is an additional drain overlying the stomach, which may reflect a temporary G-tube, on clinical correlation.  The contrast injected into the GJ tube primarily fills the jejunum. However, significant contrast is also noted within the stomach.  As there is no contrast whatsoever within the gastric antrum, or in the first and second segments of the duodenum,  this raises suspicion for leak from the proximal aspect of the patient's GJ tube into the stomach, and may explain the recurrent vomiting of feedings.  The visualized bowel gas pattern is grossly unremarkable.  No free intra-abdominal air is identified, though evaluation for free air is suboptimal on a supine views.  Scattered stool is noted within the colon.  No acute osseous abnormalities are seen.  The visualized lung bases are grossly clear.  IMPRESSION: GJ tube noted ending within the jejunum as expected.  The balloon of the tube is seen overlying the body of the stomach.  Additional drain overlying the stomach may reflect a temporary G-tube, on clinical correlation.  On injection of contrast into the GJ tube, contrast is seen both within the jejunum and the gastric fundus.  No contrast is seen within the gastric antrum or the proximal duodenum, raising suspicion for leak from the proximal aspect of the GJ tube into the stomach.  This may explain the patient's recurrent vomiting of feedings.  These results were called by telephone on 07/20/2012 at 12:35 a.m. to [facility] PA, who verbally acknowledged these results.   Original Report Authenticated By: Tonia Ghent, M.D.      1. Nausea and vomiting   2. Gastric outlet obstruction   3. Complication of feeding tube       MDM  57 year old female to emergency Department complaining of nausea and vomiting after GJ tube placed for gastric outlet obstruction.  Imaging shows die in stomach representing possible leak versus reflux.  Patient will be transferred  back to [facility] as they are the gastroenterologist who did the surgery.  Other labs reviewed without significant acute abnormalities.  Patient given IV fluids with improved heart rate.  Note that patient was found to have chronic intermittent tachycardia throughout her hospital stay at [facility] and V/Q scan was performed with low concern for pulmonary embolism.  Patient is aware transfer and is agreeable.  At this time there does not appear to be any evidence of an acute emergency medical condition and the patient appears stable for discharge with appropriate outpatient follow up.Diagnosis was discussed with patient who verbalizes understanding and is agreeable to discharge  Jaci Carrel, PA-C 07/21/12 1518

## 2012-07-20 DIAGNOSIS — E870 Hyperosmolality and hypernatremia: Secondary | ICD-10-CM | POA: Insufficient documentation

## 2012-07-20 LAB — URINALYSIS, ROUTINE W REFLEX MICROSCOPIC
Glucose, UA: NEGATIVE mg/dL
Ketones, ur: 15 mg/dL — AB
Nitrite: NEGATIVE
Protein, ur: 30 mg/dL — AB
pH: 7 (ref 5.0–8.0)

## 2012-07-20 LAB — CBC WITH DIFFERENTIAL/PLATELET
Basophils Absolute: 0 10*3/uL (ref 0.0–0.1)
Eosinophils Absolute: 0.1 10*3/uL (ref 0.0–0.7)
Eosinophils Relative: 1 % (ref 0–5)
Lymphs Abs: 1.9 10*3/uL (ref 0.7–4.0)
MCH: 27.3 pg (ref 26.0–34.0)
MCHC: 30.7 g/dL (ref 30.0–36.0)
Monocytes Absolute: 0.7 10*3/uL (ref 0.1–1.0)
Neutrophils Relative %: 62 % (ref 43–77)
Platelets: 269 10*3/uL (ref 150–400)
RBC: 3.84 MIL/uL — ABNORMAL LOW (ref 3.87–5.11)
RDW: 16.9 % — ABNORMAL HIGH (ref 11.5–15.5)

## 2012-07-20 LAB — COMPREHENSIVE METABOLIC PANEL
AST: 17 U/L (ref 0–37)
Albumin: 2.8 g/dL — ABNORMAL LOW (ref 3.5–5.2)
BUN: 21 mg/dL (ref 6–23)
Calcium: 9.6 mg/dL (ref 8.4–10.5)
Creatinine, Ser: 0.91 mg/dL (ref 0.50–1.10)
Total Protein: 7.3 g/dL (ref 6.0–8.3)

## 2012-07-20 LAB — URINE MICROSCOPIC-ADD ON

## 2012-07-20 LAB — LIPASE, BLOOD: Lipase: 25 U/L (ref 11–59)

## 2012-07-20 MED ORDER — IOHEXOL 300 MG/ML  SOLN
50.0000 mL | Freq: Once | INTRAMUSCULAR | Status: AC | PRN
  Administered 2012-07-20: 50 mL

## 2012-07-20 MED ORDER — POTASSIUM CHLORIDE 10 MEQ/100ML IV SOLN
10.0000 meq | INTRAVENOUS | Status: AC
  Administered 2012-07-20 (×2): 10 meq via INTRAVENOUS
  Filled 2012-07-20 (×2): qty 100

## 2012-07-20 NOTE — ED Notes (Signed)
Pt is unable to urinate at this time. 

## 2012-07-20 NOTE — ED Notes (Signed)
Unsuccessful sticks for blood draw, notified lab. Lab is sending someone to collect.

## 2012-07-21 DIAGNOSIS — I509 Heart failure, unspecified: Secondary | ICD-10-CM | POA: Insufficient documentation

## 2012-07-21 NOTE — ED Provider Notes (Signed)
Medical screening examination/treatment/procedure(s) were conducted as a shared visit with non-physician practitioner(s) and myself.  I personally evaluated the patient during the encounter.  Pt with placement of GJ tube at [facility] recently. Placed due to gastric outlet obstruction.  Despite decreasing feeds per her [facility] MD, she is still having n/v.  KUB with contrast shows leakage from tube.  D/w Dr Allena Katz from [facility], who accepts in transfer.  Pt is agreeable to transfer.  Olivia Mackie, MD 07/21/12 2101

## 2012-07-22 LAB — GLUCOSE, CAPILLARY

## 2012-08-10 ENCOUNTER — Encounter (HOSPITAL_COMMUNITY): Payer: Self-pay | Admitting: *Deleted

## 2012-08-10 ENCOUNTER — Emergency Department (HOSPITAL_COMMUNITY)
Admission: EM | Admit: 2012-08-10 | Discharge: 2012-08-10 | Disposition: A | Discharge status: Home / Self Care | Payer: Medicare Other | Attending: Emergency Medicine | Admitting: Emergency Medicine

## 2012-08-10 DIAGNOSIS — Z87891 Personal history of nicotine dependence: Secondary | ICD-10-CM | POA: Insufficient documentation

## 2012-08-10 DIAGNOSIS — E119 Type 2 diabetes mellitus without complications: Secondary | ICD-10-CM | POA: Insufficient documentation

## 2012-08-10 DIAGNOSIS — Z91040 Latex allergy status: Secondary | ICD-10-CM | POA: Insufficient documentation

## 2012-08-10 DIAGNOSIS — Z8739 Personal history of other diseases of the musculoskeletal system and connective tissue: Secondary | ICD-10-CM | POA: Insufficient documentation

## 2012-08-10 DIAGNOSIS — I10 Essential (primary) hypertension: Secondary | ICD-10-CM | POA: Insufficient documentation

## 2012-08-10 DIAGNOSIS — Z794 Long term (current) use of insulin: Secondary | ICD-10-CM | POA: Insufficient documentation

## 2012-08-10 DIAGNOSIS — Z862 Personal history of diseases of the blood and blood-forming organs and certain disorders involving the immune mechanism: Secondary | ICD-10-CM | POA: Insufficient documentation

## 2012-08-10 DIAGNOSIS — Z8639 Personal history of other endocrine, nutritional and metabolic disease: Secondary | ICD-10-CM | POA: Insufficient documentation

## 2012-08-10 DIAGNOSIS — K9423 Gastrostomy malfunction: Secondary | ICD-10-CM | POA: Insufficient documentation

## 2012-08-10 DIAGNOSIS — E669 Obesity, unspecified: Secondary | ICD-10-CM | POA: Insufficient documentation

## 2012-08-10 DIAGNOSIS — Z79899 Other long term (current) drug therapy: Secondary | ICD-10-CM | POA: Insufficient documentation

## 2012-08-10 DIAGNOSIS — K9413 Enterostomy malfunction: Secondary | ICD-10-CM

## 2012-08-10 NOTE — ED Notes (Signed)
Pt c/o clogged j tube. Pt will flush tube and than it reclogs again. Currently unflushable

## 2012-08-11 NOTE — ED Provider Notes (Signed)
History    CSN: [id number] Arrival date & time 08/10/12  1943  First MD Initiated Contact with Patient 08/10/12 2030     Chief Complaint  Patient presents with  . J tube clogged    (Consider location/radiation/quality/duration/timing/severity/associated sxs/prior Treatment) Patient is a 57 y.o. female presenting with GI illness.  GI Problem This is a recurrent problem. Episode onset: 3 days ago. The problem occurs intermittently. The problem has been unchanged. Pertinent negatives include no abdominal pain, change in bowel habit, chest pain, chills, congestion, coughing, fever, nausea, numbness, rash, sore throat or vomiting. Associated symptoms comments: Frequent j tube clogging. Nothing aggravates the symptoms. She has tried nothing for the symptoms.   Past Medical History  Diagnosis Date  . Diabetes mellitus   . Hypertension   . Hypercholesterolemia   . Arthritis of knee   . Anemia 08/02/2011  . Obesity   . S/P left knee arthroscopy    Past Surgical History  Procedure Laterality Date  . Tubaligation  1981  . Endoscopy  4/7/214   Family History  Problem Relation Age of Onset  . Diabetes type II Father    History  Substance Use Topics  . Smoking status: Former Smoker    Quit date: 02/14/1996  . Smokeless tobacco: Never Used  . Alcohol Use: No   OB History   Grav Para Term Preterm Abortions TAB SAB Ect Mult Living                 Review of Systems  Constitutional: Negative for fever and chills.  HENT: Negative for congestion, sore throat and rhinorrhea.   Eyes: Negative for photophobia and visual disturbance.  Respiratory: Negative for cough and shortness of breath.   Cardiovascular: Negative for chest pain and leg swelling.  Gastrointestinal: Negative for nausea, vomiting, abdominal pain, diarrhea, constipation and change in bowel habit.  Endocrine: Negative for polyphagia and polyuria.  Genitourinary: Negative for dysuria, flank pain, vaginal bleeding,  vaginal discharge and enuresis.  Musculoskeletal: Negative for back pain and gait problem.  Skin: Negative for color change and rash.  Neurological: Negative for dizziness, syncope, light-headedness and numbness.  Hematological: Negative for adenopathy. Does not bruise/bleed easily.  All other systems reviewed and are negative.    Allergies  Latex  Home Medications   Current Outpatient Rx  Name  Route  Sig  Dispense  Refill  . alum & mag hydroxide-simeth (MAALOX/MYLANTA) 200-200-20 MG/5ML suspension   Tube   Give 20 mLs by tube daily as needed for indigestion. OTC "Comfort Gel" generic for Mylanta         . amLODipine (NORVASC) 1 mg/mL SUSP oral suspension   Tube   Give 5 mg by tube every morning.         . Difluprednate (DUREZOL) 0.05 % EMUL   Right Eye   Place 1 drop into the right eye 2 (two) times daily.         . insulin NPH (HUMULIN N,NOVOLIN N) 100 UNIT/ML injection   Subcutaneous   Inject 20 Units into the skin every 6 (six) hours.          Marland Kitchen omeprazole (PRILOSEC) 2 mg/mL SUSP   Tube   Give 40 mg by tube daily as needed (heartburn or acid reflux).         . ondansetron (ZOFRAN) 4 MG/5ML solution   Oral   Take 4 mg by mouth daily as needed for nausea.         Marland Kitchen  oxyCODONE (ROXICODONE) 5 MG/5ML solution   Tube   Give 5 mg by tube 2 (two) times daily as needed for pain.          . polyethylene glycol (MIRALAX / GLYCOLAX) packet   Tube   Give 17 g by tube every morning.           BP 100/59  Pulse 84  Temp(Src) 98.3 F (36.8 C) (Oral)  Resp 18  SpO2 100% Physical Exam  Vitals reviewed. Constitutional: She is oriented to person, place, and time. She appears well-developed and well-nourished.  HENT:  Head: Normocephalic and atraumatic.  Right Ear: External ear normal.  Left Ear: External ear normal.  Eyes: Conjunctivae and EOM are normal. Pupils are equal, round, and reactive to light.  Neck: Normal range of motion. Neck supple.   Cardiovascular: Normal rate, regular rhythm, normal heart sounds and intact distal pulses.   Pulmonary/Chest: Effort normal and breath sounds normal.  Abdominal: Soft. Bowel sounds are normal. There is no tenderness.  j tube and drain site c/d/i  Musculoskeletal: Normal range of motion.  Neurological: She is alert and oriented to person, place, and time.  Skin: Skin is warm and dry.    ED Course  Procedures (including critical care time) Labs Reviewed - No data to display No results found. 1. Malfunctioning jejunostomy tube     MDM   57 y.o. female  with pertinent PMH of DM, gastric outlet obstruction presents with suspicion for clogged J tube.  For 3 days feeding has been interrupted by disruption of J tube from feeding pump, intermittently able to flush, progressively more difficult, so presents for concern of obstruction or malfunction of j tube.  No abd pain or other concerning symptoms.  J tube flushed after initial resistance.  No pain, flushed with 60cc syringe without complication.  Stable to dc home with GI fu. Given strict return precautions for changing or worsening symptoms, voices understanding, and agrees to fu. .    Labs and imaging as above reviewed by myself and attending,Dr. Bebe Shaggy, with whom case was discussed.   1. Malfunctioning jejunostomy tube       Noel Gerold, MD 08/11/12 0104

## 2012-08-14 NOTE — ED Provider Notes (Signed)
I have personally seen and examined the patient.  I have discussed the plan of care with the resident.  I have reviewed the documentation on PMH/FH/Soc. History.  I have reviewed the documentation of the resident and agree.  Pt well apperaing, no distress, no new pain complaints Jtube flushed with 60ml per nurse.   Patient comfortable with d/c home and f/u as outpatient   Joya Gaskins, MD 08/14/12 [phone]

## 2012-09-05 DIAGNOSIS — C801 Malignant (primary) neoplasm, unspecified: Secondary | ICD-10-CM

## 2012-09-05 HISTORY — DX: Malignant (primary) neoplasm, unspecified: C80.1

## 2012-09-06 DIAGNOSIS — C169 Malignant neoplasm of stomach, unspecified: Secondary | ICD-10-CM | POA: Insufficient documentation

## 2012-09-11 HISTORY — PX: GASTRIC ROUX-EN-Y: SHX5262

## 2012-09-11 HISTORY — PX: GASTRECTOMY: SHX58

## 2012-09-16 ENCOUNTER — Ambulatory Visit (INDEPENDENT_AMBULATORY_CARE_PROVIDER_SITE_OTHER): Payer: Medicare Other | Admitting: Ophthalmology

## 2012-10-10 DIAGNOSIS — I82409 Acute embolism and thrombosis of unspecified deep veins of unspecified lower extremity: Secondary | ICD-10-CM | POA: Insufficient documentation

## 2012-10-20 IMAGING — US US RENAL
1 series · 14 of 25 positions shown · non-contrast
Comparison: CT 08/17/2008

CLINICAL DATA: Acute renal failure.

RENAL/URINARY TRACT ULTRASOUND COMPLETE

[Series 1: us renal · 0.36mm/px · 14 of 30 slices shown]
[im 1/30]
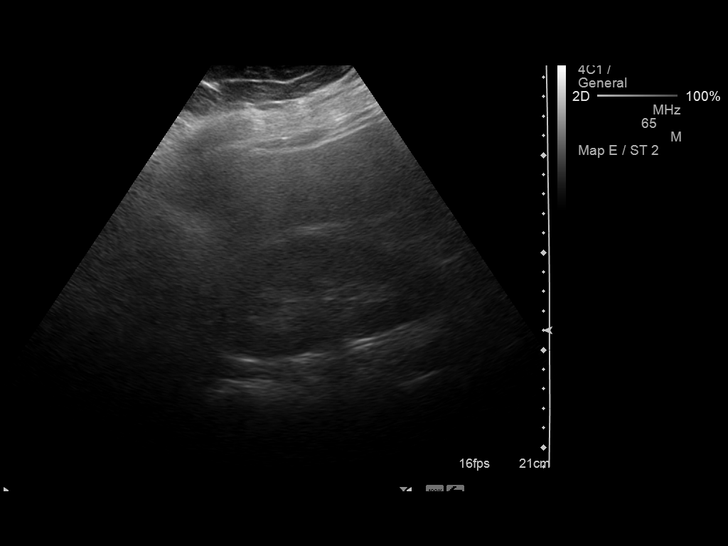
[im 3/30]
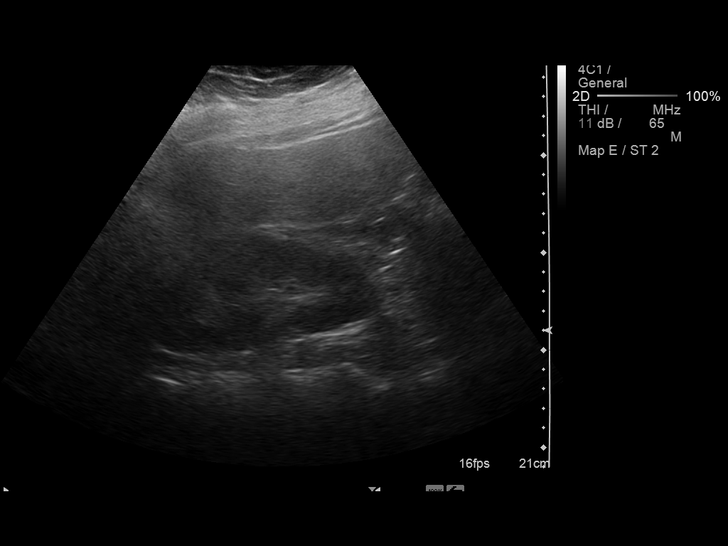
[im 5/30]
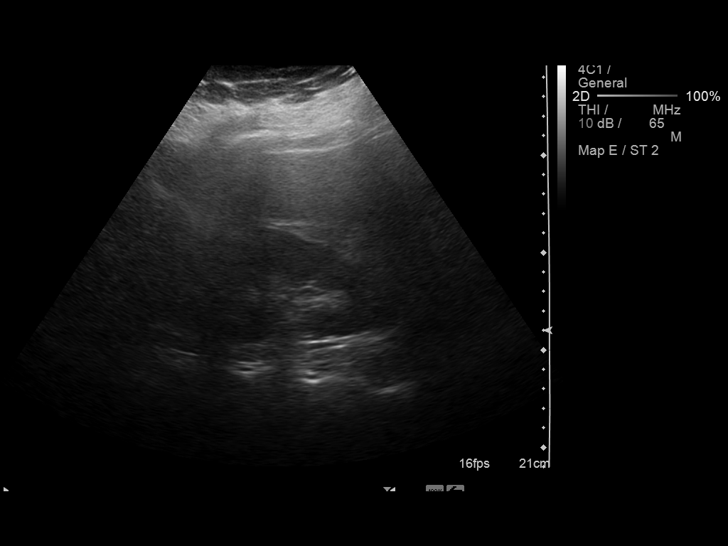
[im 8/30]
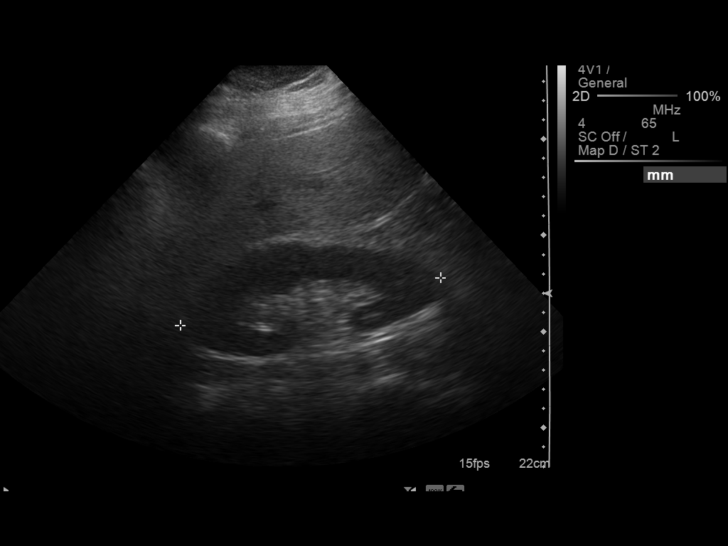
[im 10/30]
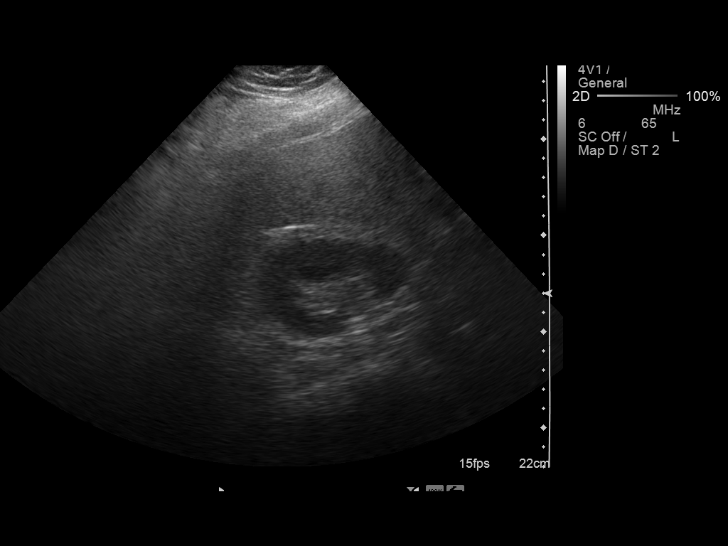
[im 11/30]
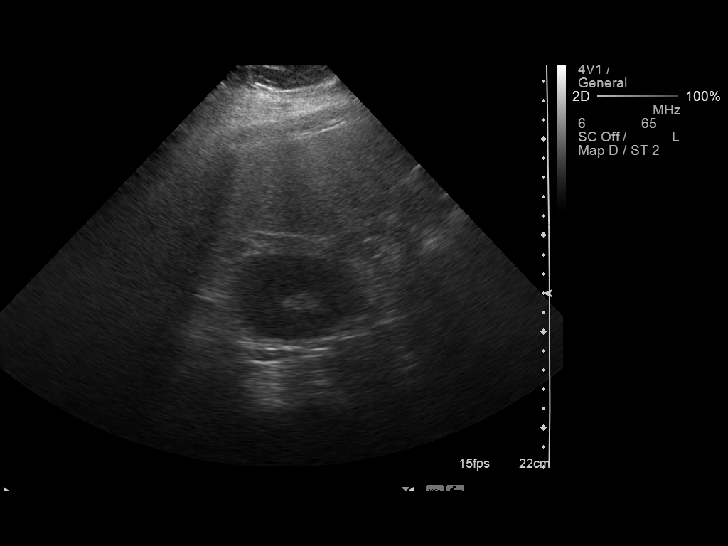
[im 14/30]
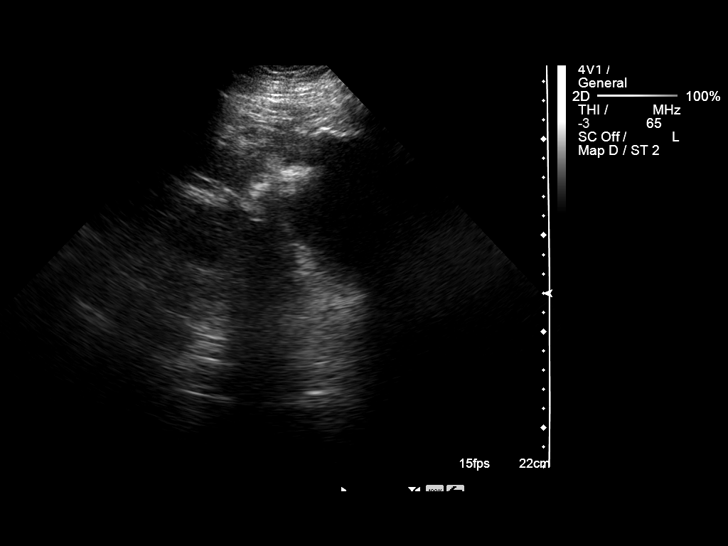
[im 16/30]
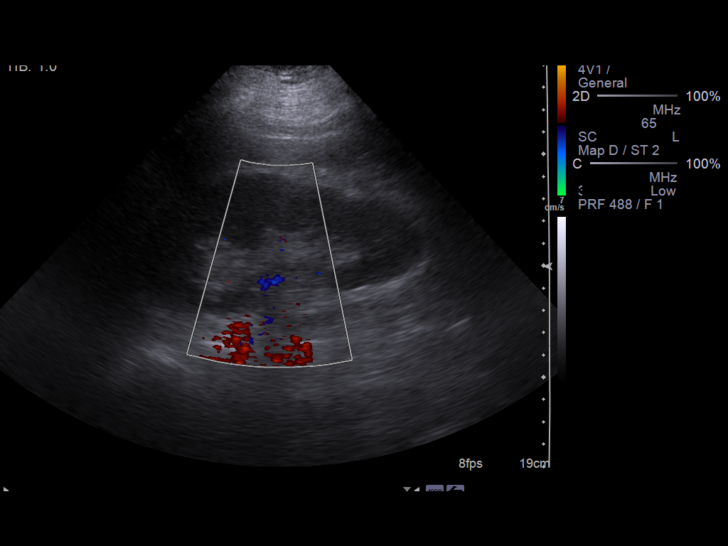
[im 19/30]
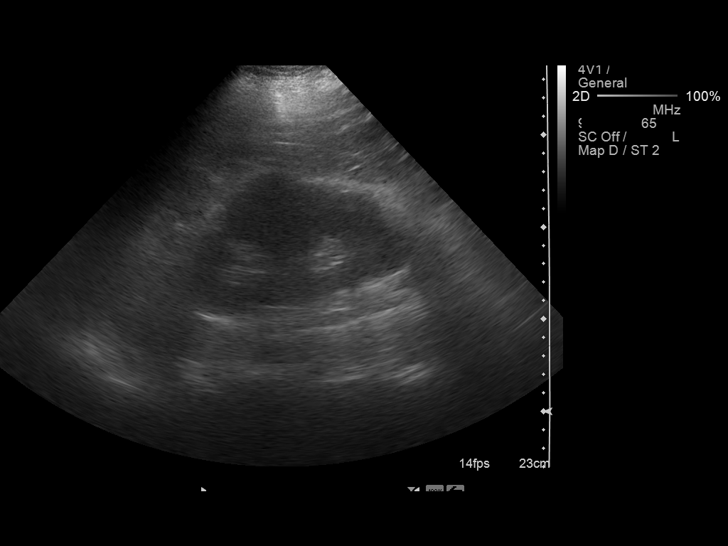
[im 20/30]
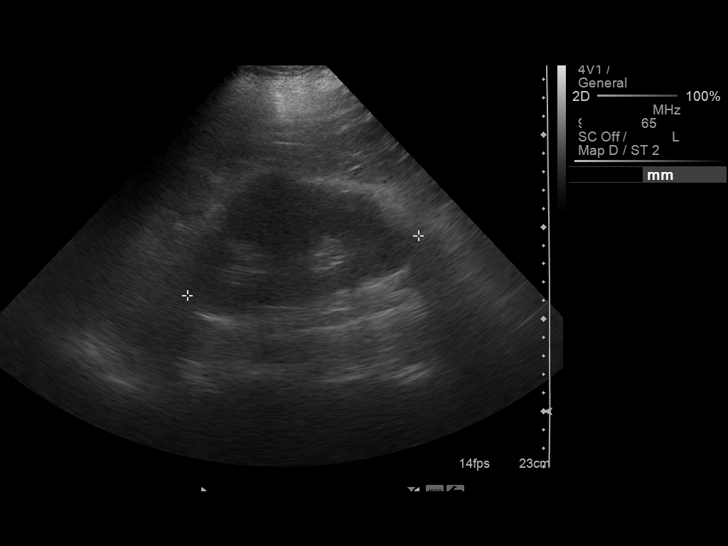
[im 22/30]
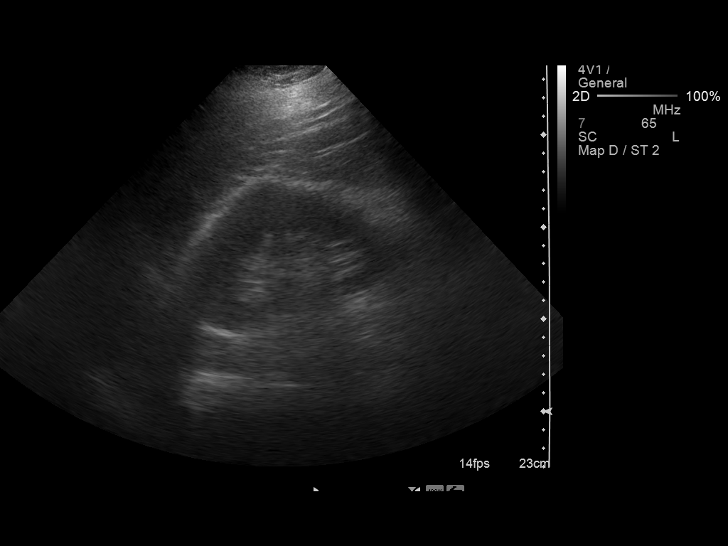
[im 25/30]
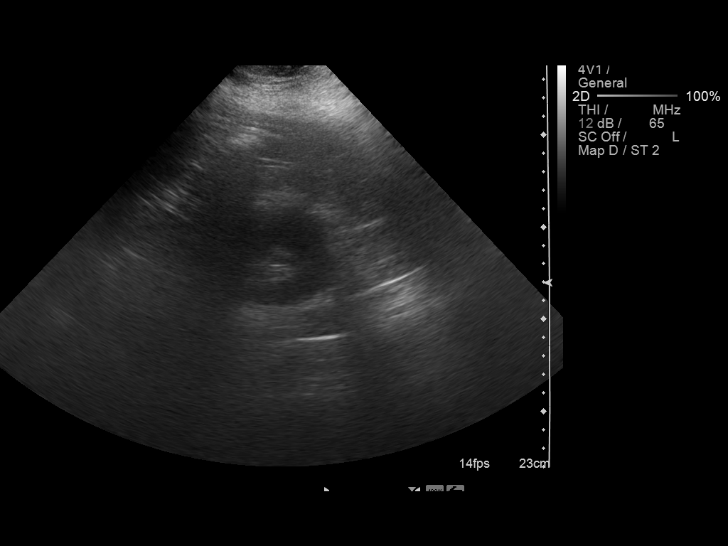
[im 27/30]
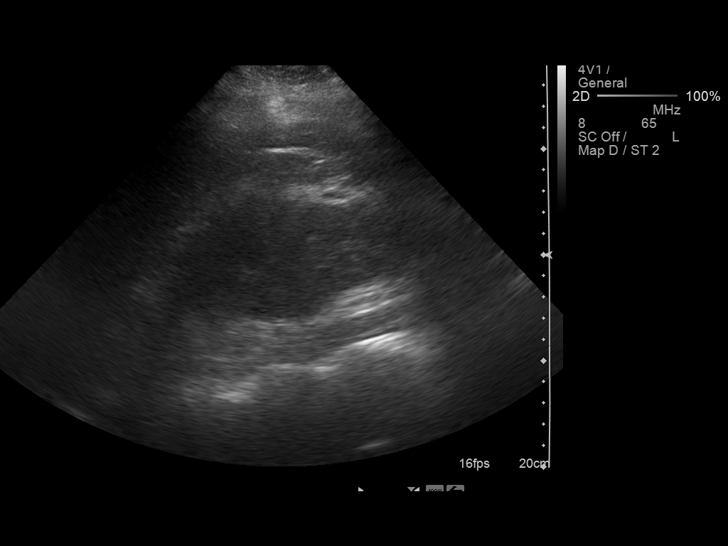
[im 30/30]
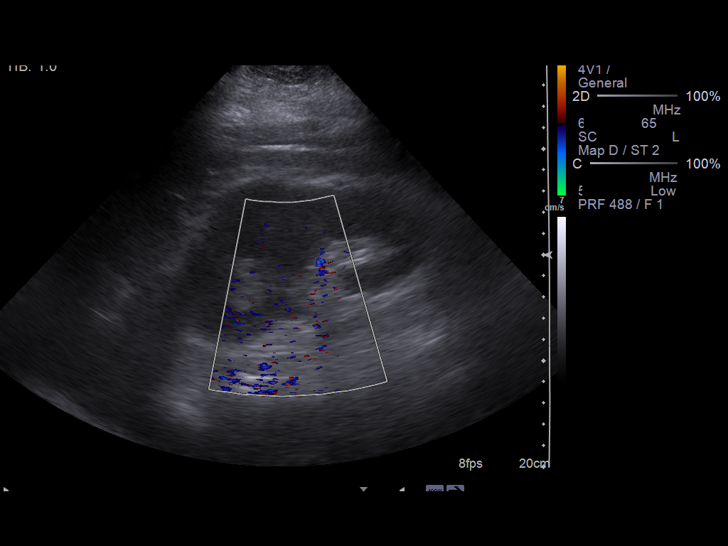

[14 of 25 positions shown; findings below may reference images not displayed]

FINDINGS: Right Kidney:  13.7 cm. Normal size and echotexture.  No focal
abnormality.  No hydronephrosis.

Left Kidney:  13.0 cm. Normal size and echotexture.  No focal
abnormality.  No hydronephrosis.

Bladder:  Decompressed and not well visualized.
IMPRESSION: No acute findings.

## 2012-11-06 HISTORY — PX: LAPAROSCOPY: SHX197

## 2012-12-19 ENCOUNTER — Other Ambulatory Visit: Payer: Self-pay

## 2012-12-30 ENCOUNTER — Ambulatory Visit (INDEPENDENT_AMBULATORY_CARE_PROVIDER_SITE_OTHER): Payer: Medicare Other | Admitting: Ophthalmology

## 2013-01-27 ENCOUNTER — Ambulatory Visit (INDEPENDENT_AMBULATORY_CARE_PROVIDER_SITE_OTHER): Payer: Medicare Other | Admitting: Ophthalmology

## 2013-03-24 ENCOUNTER — Ambulatory Visit (INDEPENDENT_AMBULATORY_CARE_PROVIDER_SITE_OTHER): Payer: Medicare Other | Admitting: Ophthalmology

## 2013-03-24 DIAGNOSIS — H2510 Age-related nuclear cataract, unspecified eye: Secondary | ICD-10-CM

## 2013-03-24 DIAGNOSIS — E1165 Type 2 diabetes mellitus with hyperglycemia: Secondary | ICD-10-CM

## 2013-03-24 DIAGNOSIS — E11359 Type 2 diabetes mellitus with proliferative diabetic retinopathy without macular edema: Secondary | ICD-10-CM

## 2013-03-24 DIAGNOSIS — H43819 Vitreous degeneration, unspecified eye: Secondary | ICD-10-CM

## 2013-03-24 DIAGNOSIS — E1139 Type 2 diabetes mellitus with other diabetic ophthalmic complication: Secondary | ICD-10-CM

## 2013-03-24 DIAGNOSIS — E11319 Type 2 diabetes mellitus with unspecified diabetic retinopathy without macular edema: Secondary | ICD-10-CM

## 2013-03-24 DIAGNOSIS — I10 Essential (primary) hypertension: Secondary | ICD-10-CM

## 2013-03-24 DIAGNOSIS — H3581 Retinal edema: Secondary | ICD-10-CM

## 2013-03-24 DIAGNOSIS — H35039 Hypertensive retinopathy, unspecified eye: Secondary | ICD-10-CM

## 2013-03-26 ENCOUNTER — Ambulatory Visit (INDEPENDENT_AMBULATORY_CARE_PROVIDER_SITE_OTHER): Payer: Medicare Other | Admitting: Podiatry

## 2013-03-26 ENCOUNTER — Encounter: Payer: Self-pay | Admitting: Podiatry

## 2013-03-26 VITALS — BP 130/72 | HR 101 | Resp 16

## 2013-03-26 DIAGNOSIS — M79609 Pain in unspecified limb: Secondary | ICD-10-CM

## 2013-03-26 DIAGNOSIS — B351 Tinea unguium: Secondary | ICD-10-CM

## 2013-03-28 NOTE — Progress Notes (Signed)
Subjective:     Patient ID: Katie West, female   DOB: 1955/02/15, 58 y.o.   MRN: [id number]  HPI patient presents with painful nailbeds 1-5 both feet that she cannot cut   Review of Systems     Objective:   Physical Exam Neurovascular status unchanged well oriented x3 with nail disease and pain 1-5 both feet    Assessment:     Mycotic nail infection with pain 1-5 both feet    Plan:     Debridement painful nail bed with no bleeding noted 1-5 both feet

## 2013-04-01 ENCOUNTER — Other Ambulatory Visit (INDEPENDENT_AMBULATORY_CARE_PROVIDER_SITE_OTHER): Payer: Medicare Other | Admitting: Ophthalmology

## 2013-04-09 ENCOUNTER — Other Ambulatory Visit (INDEPENDENT_AMBULATORY_CARE_PROVIDER_SITE_OTHER): Payer: Medicare Other | Admitting: Ophthalmology

## 2013-04-15 ENCOUNTER — Other Ambulatory Visit (INDEPENDENT_AMBULATORY_CARE_PROVIDER_SITE_OTHER): Payer: Medicare Other | Admitting: Ophthalmology

## 2013-04-15 DIAGNOSIS — E11359 Type 2 diabetes mellitus with proliferative diabetic retinopathy without macular edema: Secondary | ICD-10-CM

## 2013-04-15 DIAGNOSIS — E1039 Type 1 diabetes mellitus with other diabetic ophthalmic complication: Secondary | ICD-10-CM

## 2013-04-15 DIAGNOSIS — E1065 Type 1 diabetes mellitus with hyperglycemia: Secondary | ICD-10-CM

## 2013-04-18 ENCOUNTER — Encounter: Payer: Self-pay | Admitting: Obstetrics and Gynecology

## 2013-04-18 ENCOUNTER — Ambulatory Visit (INDEPENDENT_AMBULATORY_CARE_PROVIDER_SITE_OTHER): Payer: Medicare Other | Admitting: Obstetrics and Gynecology

## 2013-04-18 ENCOUNTER — Telehealth: Payer: Self-pay | Admitting: Obstetrics and Gynecology

## 2013-04-18 ENCOUNTER — Other Ambulatory Visit (HOSPITAL_COMMUNITY)
Admission: RE | Admit: 2013-04-18 | Discharge: 2013-04-18 | Disposition: A | Discharge status: Home / Self Care | Payer: Medicare Other | Source: Ambulatory Visit | Attending: Obstetrics and Gynecology | Admitting: Obstetrics and Gynecology

## 2013-04-18 VITALS — BP 133/79 | HR 84 | Ht 73.0 in | Wt 221.0 lb

## 2013-04-18 DIAGNOSIS — Z124 Encounter for screening for malignant neoplasm of cervix: Secondary | ICD-10-CM | POA: Insufficient documentation

## 2013-04-18 DIAGNOSIS — Z01419 Encounter for gynecological examination (general) (routine) without abnormal findings: Secondary | ICD-10-CM

## 2013-04-18 DIAGNOSIS — Z1151 Encounter for screening for human papillomavirus (HPV): Secondary | ICD-10-CM | POA: Insufficient documentation

## 2013-04-18 MED ORDER — ESTRADIOL 0.1 MG/GM VA CREA: TOPICAL_CREAM | VAGINAL | Status: AC

## 2013-04-18 NOTE — Progress Notes (Signed)
Pain with intercourse since starting chemo.

## 2013-04-18 NOTE — Telephone Encounter (Signed)
Pharmacist called regarding Estrace cream not covered under patients insurance plan. Looking for authorization for premarin cream instead. Authorization given. J.Rasch NP

## 2013-04-18 NOTE — Progress Notes (Signed)
  Subjective:     Katie West is a 58 y.o. female G5P5 who is here for a comprehensive physical exam. The patient reports dyspareunia. She was diagnosed with stomach cancer in July 2014 and is undergoing chemotherapy treatment. She has had dyspareunia since starting chemotherapy. Patient has been postmenopausal for at least 5 years. Patient is otherwise doing well without complaints.   History   Social History  . Marital Status: Married    Spouse Name: N/A    Number of Children: N/A  . Years of Education: N/A   Occupational History  . Not on file.   Social History Main Topics  . Smoking status: Former Smoker    Quit date: 02/14/1996  . Smokeless tobacco: Never Used  . Alcohol Use: No  . Drug Use: No  . Sexual Activity: Not Currently    Birth Control/ Protection: Post-menopausal   Other Topics Concern  . Not on file   Social History Narrative  . No narrative on file   Health Maintenance  Topic Date Due  . Pap Smear  10/04/1973  . Tetanus/tdap  10/05/1974  . Colonoscopy  10/04/2005  . Influenza Vaccine  11/18/2013 (Originally 09/13/2012)  . Mammogram  05/16/2013   Past Medical History  Diagnosis Date  . Diabetes mellitus   . Hypertension   . Hypercholesterolemia   . Arthritis of knee   . Anemia 08/02/2011  . Obesity   . S/P left knee arthroscopy    Past Surgical History  Procedure Laterality Date  . Tubaligation  1981  . Endoscopy  4/7/214   Family History  Problem Relation Age of Onset  . Diabetes type II Father    History  Substance Use Topics  . Smoking status: Former Smoker    Quit date: 02/14/1996  . Smokeless tobacco: Never Used  . Alcohol Use: No       Review of Systems A comprehensive review of systems was negative.   Objective:      GENERAL: Well-developed, well-nourished female in no acute distress.  HEENT: Normocephalic, atraumatic. Sclerae anicteric.  NECK: Supple. Normal thyroid.  LUNGS: Clear to auscultation bilaterally.  HEART:  Regular rate and rhythm. BREASTS: Symmetric in size. No palpable masses or lymphadenopathy, skin changes, or nipple drainage. ABDOMEN: Soft, nontender, nondistended. Feeding tube present and secured in place my a clean dressing. PELVIC: Normal external female genitalia. Vagina is atrophic.  Normal discharge. Normal atrophic cervix. Uterus is normal in size. No adnexal mass or tenderness. EXTREMITIES: No cyanosis, clubbing, or edema, 2+ distal pulses.    Assessment:    Healthy female exam.      Plan:    pap smear collected Referral for screening mammography provided Rx estrace cream given. Also recommended the use of vaginal lubricants See After Visit Summary for Counseling Recommendations

## 2013-04-30 ENCOUNTER — Other Ambulatory Visit: Payer: Self-pay | Admitting: Obstetrics and Gynecology

## 2013-04-30 ENCOUNTER — Ambulatory Visit (HOSPITAL_COMMUNITY)
Admission: RE | Admit: 2013-04-30 | Discharge: 2013-04-30 | Disposition: A | Discharge status: Home / Self Care | Payer: Medicare Other | Source: Ambulatory Visit | Attending: Obstetrics and Gynecology | Admitting: Obstetrics and Gynecology

## 2013-04-30 DIAGNOSIS — Z01419 Encounter for gynecological examination (general) (routine) without abnormal findings: Secondary | ICD-10-CM

## 2013-04-30 DIAGNOSIS — Z1231 Encounter for screening mammogram for malignant neoplasm of breast: Secondary | ICD-10-CM

## 2013-06-02 ENCOUNTER — Encounter: Payer: Self-pay | Admitting: Family Medicine

## 2013-06-02 ENCOUNTER — Ambulatory Visit (INDEPENDENT_AMBULATORY_CARE_PROVIDER_SITE_OTHER): Payer: Medicare Other | Admitting: Family Medicine

## 2013-06-02 VITALS — BP 144/78 | HR 114 | Temp 98.6°F | Ht 73.0 in | Wt 216.0 lb

## 2013-06-02 DIAGNOSIS — I10 Essential (primary) hypertension: Secondary | ICD-10-CM

## 2013-06-02 DIAGNOSIS — E785 Hyperlipidemia, unspecified: Secondary | ICD-10-CM

## 2013-06-02 DIAGNOSIS — D649 Anemia, unspecified: Secondary | ICD-10-CM

## 2013-06-02 DIAGNOSIS — C169 Malignant neoplasm of stomach, unspecified: Secondary | ICD-10-CM

## 2013-06-02 DIAGNOSIS — E119 Type 2 diabetes mellitus without complications: Secondary | ICD-10-CM

## 2013-06-02 LAB — CBC WITH DIFFERENTIAL/PLATELET
BASOS ABS: 0 10*3/uL (ref 0.0–0.1)
BASOS PCT: 0 % (ref 0–1)
EOS ABS: 0.1 10*3/uL (ref 0.0–0.7)
EOS PCT: 2 % (ref 0–5)
HCT: 29.9 % — ABNORMAL LOW (ref 36.0–46.0)
Hemoglobin: 9.6 g/dL — ABNORMAL LOW (ref 12.0–15.0)
Lymphocytes Relative: 29 % (ref 12–46)
Lymphs Abs: 0.8 10*3/uL (ref 0.7–4.0)
MCH: 24.9 pg — AB (ref 26.0–34.0)
MCHC: 32.1 g/dL (ref 30.0–36.0)
MCV: 77.5 fL — AB (ref 78.0–100.0)
Monocytes Absolute: 0.2 10*3/uL (ref 0.1–1.0)
Monocytes Relative: 6 % (ref 3–12)
NEUTROS PCT: 63 % (ref 43–77)
Neutro Abs: 1.6 10*3/uL — ABNORMAL LOW (ref 1.7–7.7)
PLATELETS: 255 10*3/uL (ref 150–400)
RBC: 3.86 MIL/uL — AB (ref 3.87–5.11)
RDW: 20.6 % — AB (ref 11.5–15.5)
WBC: 2.6 10*3/uL — ABNORMAL LOW (ref 4.0–10.5)

## 2013-06-02 LAB — COMPREHENSIVE METABOLIC PANEL
ALK PHOS: 41 U/L (ref 39–117)
ALT: 12 U/L (ref 0–35)
AST: 16 U/L (ref 0–37)
Albumin: 3.2 g/dL — ABNORMAL LOW (ref 3.5–5.2)
BILIRUBIN TOTAL: 0.4 mg/dL (ref 0.2–1.2)
BUN: 13 mg/dL (ref 6–23)
CO2: 25 meq/L (ref 19–32)
CREATININE: 0.75 mg/dL (ref 0.50–1.10)
Calcium: 8.8 mg/dL (ref 8.4–10.5)
Chloride: 108 mEq/L (ref 96–112)
Glucose, Bld: 77 mg/dL (ref 70–99)
Potassium: 3.7 mEq/L (ref 3.5–5.3)
SODIUM: 139 meq/L (ref 135–145)
TOTAL PROTEIN: 5.6 g/dL — AB (ref 6.0–8.3)

## 2013-06-02 LAB — LIPID PANEL
CHOL/HDL RATIO: 3.9 ratio
Cholesterol: 129 mg/dL (ref 0–200)
HDL: 33 mg/dL — AB (ref >39)
LDL Cholesterol: 83 mg/dL (ref 0–99)
Triglycerides: 67 mg/dL (ref <150)
VLDL: 13 mg/dL (ref 0–40)

## 2013-06-02 LAB — POCT GLYCOSYLATED HEMOGLOBIN (HGB A1C): HEMOGLOBIN A1C: 6.2

## 2013-06-02 NOTE — Progress Notes (Signed)
   Subjective:    Patient ID: Katie West, female    DOB: 04-Dec-1955, 58 y.o.   MRN: [id number]  HPI: Pt presents to clinic to establish care. She has no current symptoms. She is seen at [facility] (Dr. 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  Stomach cancer - currently without great symptoms (reviewed recent outpt notes from [place]) - reports she gets chemo 3 x per month at [facility]; currently on Taxol and ramucriumab - uses hydromorphone 2 mg q4h PRN and MS Contin 15 mg BID for pain, Flexeril 10 mg PRN for spasm, Elavil 25 mg qHS for sleep; MiraLAX and Zofran PRN as well - uses Lovenox 150 mg daily for hypercoagulable state in cancer with history of blood clot in her left leg, last year - has a G-tube and J-tube in place without current issues; uses Prilosec for reflux  DM - reports taking Novolin N 10 units BID and denies hyper- or hypoglycemic events - checks her CBG's several times per day (typically 80-100 in the AM, 125 during the day, 150's in the evening, 130's qHS)  HTN, HLD, anemia - per pt history - no complaints, currently, not on any statin medication - recent labs at [facility]; to be scanned into EPIC (baseline Hb ~8-9, ALT with chronic mild elevation, Cr baseline ~0.7-0.8) - unclear if currently taking any medications for BP  Pt is a former smoker. She is disabled and does not work. In addition to the above documentation, pt's PMH, surgical history, FH, and SH all reviewed and updated where appropriate in the EMR. I have also reviewed and updated the pt's allergies and current medications as appropriate.  Review of Systems: As above. Otherwise, full 12-system ROS was reviewed and all negative.     Objective:   Physical Exam BP 144/78  Pulse 114  Temp(Src) 98.6 F (37 C) (Oral)  Ht 6\' 1"  (1.854 m)  Wt 216 lb (97.977 kg)  BMI 28.50 kg/m2 Gen: well-appearing adult female in NAD HEENT: [place]/AT, sclerae/conjunctivae clear, no lid lag, EOMI,  PERRLA   MMM, posterior oropharynx clear, no cervical lymphadenopathy  neck supple with full ROM, no masses appreciated; thyroid not enlarged  Cardio: RRR; very soft blowing systolic murmur intermittently appreciated (subtle under breath sounds) Pulm: CTAB, no wheezes, normal WOB  Abd: soft, nondistended, BS+  G-tube and J-tube in place with clean, dry dressings and no tenderness appreciated Ext: warm/well-perfused, no cyanosis/clubbing/edema MSK: strength 5/5 in all four extremities, no frank joint deformity/effusion  normal ROM to all four extremities with no point muscle/bony tenderness in spine Neuro/Psych: alert/oriented, sensation grossly intact; normal gait/balance  mood euthymic with congruent, very pleasant affect     Assessment & Plan:  See problem list notes.  MDM Justification for New Patient Level IV visit (34742):  Problem points: 2 (est problem, stable, x2)  Data Points: 3 (review / order lab tests; review and summation of old records - see problem list notes / overview, updated hx)  Risk: Moderate (two stable chronic illnesses)

## 2013-06-02 NOTE — Assessment & Plan Note (Signed)
Baseline Hb appears ~8-9, likely secondary to malignancy. No anemic symptoms. Will check CBC today and monitor, as needed.

## 2013-06-02 NOTE — Patient Instructions (Signed)
Thank you for coming in, today! It was very nice to meet you!  Your foot / toe looks fine, but I would ask your podiatrist about your nails. We will check some baseline labs, today. I will call you or send you a letter about the results. I'll review your records from [facility] when I get them. Call your pharmacy when you need any refills and tell them your doctor is here at our office. Come back to see me in about 3 months.  Please feel free to call with any questions or concerns at any time, at [phone]. --Dr. Venetia Maxon

## 2013-06-02 NOTE — Assessment & Plan Note (Signed)
Reported history of high cholesterol. Not currently on any statin medications; not sure if previously on any statin, or not. Will check lipid panel and CMP, today and f/u as needed.

## 2013-06-02 NOTE — Assessment & Plan Note (Addendum)
Per report, well-controlled with Novolin N 10 units BID, with good CBG checking habits. No issues identified, today. Will check CMP, lipid panel, and A1c for baseline; see also scanned labs from [facility]. F/u in about 3 months.

## 2013-06-02 NOTE — Assessment & Plan Note (Signed)
A: Extensive notes from [facility] reviewed and summarized into overview. Currently doing well and following regularly at [facility]. No acute issues identified, today.  P: Continue per [facility] oncologists. Pt aware of when to call her oncology doctors / if she has a problem who to call. No changes to medications done today. Need to discuss some GOC at some point, as pt may have some measures / wishes already documented or put in place (note pt stated "I have stomach cancer," and "it's going already" very unconcernedly -- not sure if this represents denial, acceptance, or lack of insight/understanding).

## 2013-06-02 NOTE — Assessment & Plan Note (Signed)
Mild elevation, today. Need to clarify if pt is taking any HTN medications, currently. No symptoms. Will monitor routinely and consider starting / restarting medication if needed.

## 2013-06-04 ENCOUNTER — Ambulatory Visit (INDEPENDENT_AMBULATORY_CARE_PROVIDER_SITE_OTHER): Payer: Medicare Other | Admitting: Podiatry

## 2013-06-04 ENCOUNTER — Encounter: Payer: Self-pay | Admitting: Podiatry

## 2013-06-04 ENCOUNTER — Ambulatory Visit (INDEPENDENT_AMBULATORY_CARE_PROVIDER_SITE_OTHER): Payer: Medicare Other

## 2013-06-04 VITALS — BP 119/76 | HR 98 | Resp 17 | Ht 73.0 in | Wt 216.0 lb

## 2013-06-04 DIAGNOSIS — B351 Tinea unguium: Secondary | ICD-10-CM

## 2013-06-04 DIAGNOSIS — M2040 Other hammer toe(s) (acquired), unspecified foot: Secondary | ICD-10-CM

## 2013-06-04 DIAGNOSIS — M79609 Pain in unspecified limb: Secondary | ICD-10-CM

## 2013-06-04 NOTE — Progress Notes (Signed)
   Subjective:    Patient ID: Katie West, female    DOB: 21-Jan-1956, 58 y.o.   MRN: [id number]  HPI Comments: Pt noticed the discoloration to the 2nd right toenail, last week.   Pt denies known injury, or pain to the area.     Review of Systems     Objective:   Physical Exam        Assessment & Plan:

## 2013-06-05 ENCOUNTER — Encounter: Payer: Self-pay | Admitting: Family Medicine

## 2013-06-05 NOTE — Progress Notes (Signed)
Subjective:     Patient ID: Katie West, female   DOB: 1956-01-20, 58 y.o.   MRN: [id number]  HPI patient points to second nail right stating that she's noted its dark and that she is concerned that she may have an injury to it or there may be a problem because of her diabetes   Review of Systems     Objective:   Physical Exam Neurovascular status was found to be intact with muscle strength adequate and neurological unchanged. I noted right second nail is slightly darkened but there is no drainage no edema erythema or odor noted    Assessment:     Probable trauma to the right second nail with no apparent extension    Plan:     Reviewed condition and recommended no treatment and less it should start to drain become loose or painful. Reappoint as needed

## 2013-06-09 ENCOUNTER — Encounter (HOSPITAL_COMMUNITY): Payer: Self-pay | Admitting: Emergency Medicine

## 2013-06-09 ENCOUNTER — Emergency Department (HOSPITAL_COMMUNITY)
Admission: EM | Admit: 2013-06-09 | Discharge: 2013-06-09 | Disposition: A | Discharge status: Home / Self Care | Payer: Medicare Other | Attending: Emergency Medicine | Admitting: Emergency Medicine

## 2013-06-09 DIAGNOSIS — Z79899 Other long term (current) drug therapy: Secondary | ICD-10-CM | POA: Insufficient documentation

## 2013-06-09 DIAGNOSIS — T85528A Displacement of other gastrointestinal prosthetic devices, implants and grafts, initial encounter: Secondary | ICD-10-CM

## 2013-06-09 DIAGNOSIS — Y833 Surgical operation with formation of external stoma as the cause of abnormal reaction of the patient, or of later complication, without mention of misadventure at the time of the procedure: Secondary | ICD-10-CM | POA: Insufficient documentation

## 2013-06-09 DIAGNOSIS — Z794 Long term (current) use of insulin: Secondary | ICD-10-CM | POA: Insufficient documentation

## 2013-06-09 DIAGNOSIS — I10 Essential (primary) hypertension: Secondary | ICD-10-CM | POA: Insufficient documentation

## 2013-06-09 DIAGNOSIS — E669 Obesity, unspecified: Secondary | ICD-10-CM | POA: Insufficient documentation

## 2013-06-09 DIAGNOSIS — Z87891 Personal history of nicotine dependence: Secondary | ICD-10-CM | POA: Insufficient documentation

## 2013-06-09 DIAGNOSIS — Z8739 Personal history of other diseases of the musculoskeletal system and connective tissue: Secondary | ICD-10-CM | POA: Insufficient documentation

## 2013-06-09 DIAGNOSIS — Z91040 Latex allergy status: Secondary | ICD-10-CM | POA: Insufficient documentation

## 2013-06-09 DIAGNOSIS — E119 Type 2 diabetes mellitus without complications: Secondary | ICD-10-CM | POA: Insufficient documentation

## 2013-06-09 DIAGNOSIS — K9423 Gastrostomy malfunction: Secondary | ICD-10-CM | POA: Insufficient documentation

## 2013-06-09 DIAGNOSIS — Z85028 Personal history of other malignant neoplasm of stomach: Secondary | ICD-10-CM | POA: Insufficient documentation

## 2013-06-09 DIAGNOSIS — Z862 Personal history of diseases of the blood and blood-forming organs and certain disorders involving the immune mechanism: Secondary | ICD-10-CM | POA: Insufficient documentation

## 2013-06-09 NOTE — ED Provider Notes (Signed)
Medical screening examination/treatment/procedure(s) were performed by non-physician practitioner and as supervising physician I was immediately available for consultation/collaboration.   EKG Interpretation None       Threasa Beards, MD 06/09/13 1758

## 2013-06-09 NOTE — ED Notes (Addendum)
Pt reports that her G-tube has come out of place again. It was originally placed last June and came out once in the beginning of the year. g- tube replacement and supplies placed at bedside.

## 2013-06-09 NOTE — ED Notes (Signed)
Pt reporting last night her gastric tube fell out. Reports [facility] placed tube June 2014. Denies any pain to site. Pt also has feeding tube that is in place. Is a x 4. In NAD

## 2013-06-09 NOTE — ED Provider Notes (Signed)
CSN: [id number]     Arrival date & time 06/09/13  1614 History  This chart was scribed for non-physician practitioner, Delos Haring, PA-C,working with Threasa Beards, MD, by Marlowe Kays, ED Scribe.  This patient was seen in room TR11C/TR11C and the patient's care was started at 5:27 PM.  Chief Complaint  Patient presents with  . Gastric Cancer   The history is provided by the patient. No language interpreter was used.   HPI Comments:  Katie West is a 58 y.o. female with a J-Tube and a G-tube, who presents to the Emergency Department complaining of one of the tubes falling out earlier this morning. She reports some mild bleeding last night and this morning. Upon wiping the blood away, she states the tube came out today. She states this particular tube has been in for the past 2-3 months with the original one being placed in June of 2014. Pt is unsure which tube has fallen out but states she has been able to feed through one of them today.   Past Medical History  Diagnosis Date  . Diabetes mellitus   . Hypertension   . Hypercholesterolemia   . Anemia 08/02/2011  . Obesity   . S/P left knee arthroscopy   . Arthritis of knee     Osteoarthritis of right knee  . 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 Tubaligation  1981  . Endoscopy  4/7/214  . Esophagogastroduodenoscopy      Numerous procedures at [facility], related to stomach cancer. Dr. Arliss Journey, 06/19/12, 06/21/12; Dr. Roni Bread, 09/05/12; Dr. Tillie Rung, 09/13/12, with stent placement; Dr. Jola Schmidt, 12/10, with stent revision  . Other surgical history      Endoscopic stent placement secondary to stomach cancer, 09/13/12, revised 01/22/13  . Gastrectomy  09/11/12    Distal, with gastroduodenostomy; Dr. Cristino Martes, Jr  . 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 Cristino Martes, Jr  . Knee arthroscopy Right 2007  . Laparoscopy  11/06/2012    Dr.  Cristino Martes, [place]; ileostomy / jejunostomy   Family History  Problem Relation Age of Onset  . Diabetes type II Father   . Diabetes Mother   . Diabetes Sister   . Diabetes Brother    History  Substance Use Topics  . Smoking status: Former Smoker    Quit date: 02/14/1996  . Smokeless tobacco: Never Used  . Alcohol Use: No   OB History   Grav Para Term Preterm Abortions TAB SAB Ect Mult Living                 Review of Systems  Constitutional: Negative for fever.  All other systems reviewed and are negative.   Allergies  Latex  Home Medications   Prior to Admission medications   Medication Sig Start Date End Date Taking? Authorizing Provider  alum & mag hydroxide-simeth (MAALOX/MYLANTA) 200-200-20 MG/5ML suspension Give 20 mLs by tube daily as needed for indigestion. OTC "Comfort Gel" generic for Mylanta    Historical Provider, MD  amitriptyline (ELAVIL) 25 MG tablet Take by mouth.    Historical Provider, MD  cyclobenzaprine (FLEXERIL) 10 MG tablet Take 10 mg by mouth 3 (three) times daily as needed. 05/29/13   Historical Provider, MD  Difluprednate (DUREZOL) 0.05 % EMUL Place 1 drop into the right eye 2 (two) times daily.    Historical Provider, MD  enoxaparin (LOVENOX) 150  MG/ML injection Inject into the skin. 04/17/13 05/17/13  Historical Provider, MD  estradiol (ESTRACE VAGINAL) 0.1 MG/GM vaginal cream 1 applicator full at bedtime 3 times a week than taper to twice weekly and then once weekly 04/18/13   Mora Bellman, MD  HYDROmorphone HCl (DILAUDID) 1 MG/ML LIQD 2 mLs (2 mg total) by Via Tube route every 3 (three) hours as needed (pain). 04/17/13   Historical Provider, MD  insulin NPH Human (HUMULIN N,NOVOLIN N) 100 UNIT/ML injection Inject 10 Units into the skin 2 (two) times daily before a meal.    Historical Provider, MD  Insulin Pen Needle (PEN NEEDLES 31GX5/16") 31G X 8 MM MISC Use as directed. 03/20/13 03/20/14  Historical Provider, MD  omeprazole (PRILOSEC) 2 mg/mL SUSP Give 40  mg by tube daily as needed (heartburn or acid reflux).    Historical Provider, MD  omeprazole (PRILOSEC) 20 MG capsule 2 ml po every day 04/10/13   Historical Provider, MD  ondansetron (ZOFRAN) 8 MG tablet Take by mouth. 02/25/13 02/25/14  Historical Provider, MD  oxyCODONE (ROXICODONE) 5 MG/5ML solution Give 5 mg by tube 2 (two) times daily as needed for pain.     Historical Provider, MD  polyethylene glycol (MIRALAX / GLYCOLAX) packet Take by mouth. 02/17/13   Historical Provider, MD   Triage Vitals: BP 138/74  Pulse 96  Temp(Src) 97.7 F (36.5 C) (Oral)  Resp 18  SpO2 100% Physical Exam  Nursing note and vitals reviewed. Constitutional: She is oriented to person, place, and time. She appears well-developed and well-nourished.  HENT:  Head: Normocephalic and atraumatic.  Eyes: EOM are normal.  Neck: Normal range of motion.  Cardiovascular: Normal rate.   Pulmonary/Chest: Effort normal.  Abdominal:  Patient has intact g-tube that is clean and patent  J-tube is missing, small amount of leakage around the site. No signs of infection or associated cellulitis.  Musculoskeletal: Normal range of motion.  Neurological: She is alert and oriented to person, place, and time.  Skin: Skin is warm and dry.  Psychiatric: She has a normal mood and affect. Her behavior is normal.    ED Course  Procedures (including critical care time) DIAGNOSTIC STUDIES: Oxygen Saturation is 100% on RA, normal by my interpretation.   COORDINATION OF CARE: 5:33 PM- Will contact Interventional Radiologist to have J-tube replaced. Pt verbalizes understanding and agrees to plan.  5:48 PM- Spoke with Interventional Radiologist (Dr. Barbie Banner) and was given a phone number for pt to call at 7:30 AM to set up an appt to have the tube replaced early tomorrow.. Dr. Barbie Banner will put the order in when is contacted in the morning.  Patient has patent g-tube for feeding and hydration.  Medications - No data to display  Labs  Review Labs Reviewed - No data to display  Imaging Review No results found.   EKG Interpretation None      MDM   Final diagnoses:  Jejunostomy tube fell out    58 y.o.Katie West's evaluation in the Emergency Department is complete. It has been determined that no acute conditions requiring further emergency intervention are present at this time. The patient/guardian have been advised of the diagnosis and plan. We have discussed signs and symptoms that warrant return to the ED, such as changes or worsening in symptoms.  Vital signs are stable at discharge. Filed Vitals:   06/09/13 1623  BP: 138/74  Pulse: 96  Temp: 97.7 F (36.5 C)  Resp: 18    Patient/guardian has voiced  understanding and agreed to follow-up with the PCP or specialist.   I personally performed the services described in this documentation, which was scribed in my presence. The recorded information has been reviewed and is accurate.    Linus Mako, PA-C 06/09/13 1754

## 2013-06-10 ENCOUNTER — Other Ambulatory Visit (HOSPITAL_COMMUNITY): Payer: Self-pay | Admitting: Interventional Radiology

## 2013-06-10 ENCOUNTER — Ambulatory Visit (HOSPITAL_COMMUNITY)
Admission: RE | Admit: 2013-06-10 | Discharge: 2013-06-10 | Disposition: A | Discharge status: Home / Self Care | Payer: Medicare Other | Source: Ambulatory Visit | Attending: Interventional Radiology | Admitting: Interventional Radiology

## 2013-06-10 ENCOUNTER — Encounter: Payer: Self-pay | Admitting: Family Medicine

## 2013-06-10 DIAGNOSIS — C169 Malignant neoplasm of stomach, unspecified: Secondary | ICD-10-CM

## 2013-06-10 DIAGNOSIS — K9423 Gastrostomy malfunction: Secondary | ICD-10-CM | POA: Insufficient documentation

## 2013-06-10 DIAGNOSIS — E11311 Type 2 diabetes mellitus with unspecified diabetic retinopathy with macular edema: Secondary | ICD-10-CM | POA: Insufficient documentation

## 2013-06-10 DIAGNOSIS — Z85028 Personal history of other malignant neoplasm of stomach: Secondary | ICD-10-CM | POA: Insufficient documentation

## 2013-06-10 MED ORDER — IOHEXOL 300 MG/ML  SOLN
50.0000 mL | Freq: Once | INTRAMUSCULAR | Status: AC | PRN
  Administered 2013-06-10: 10 mL via INTRAVENOUS

## 2013-06-12 ENCOUNTER — Other Ambulatory Visit: Payer: Self-pay | Admitting: Family Medicine

## 2013-06-16 ENCOUNTER — Other Ambulatory Visit: Payer: Self-pay | Admitting: *Deleted

## 2013-06-16 MED ORDER — OMEPRAZOLE 2 MG/ML ORAL SUSPENSION
40.0000 mg | Freq: Every day | ORAL | Status: AC | PRN
Start: 2013-06-16 — End: ?

## 2013-06-17 ENCOUNTER — Encounter (INDEPENDENT_AMBULATORY_CARE_PROVIDER_SITE_OTHER): Payer: Medicare Other | Admitting: Ophthalmology

## 2013-06-23 ENCOUNTER — Encounter (INDEPENDENT_AMBULATORY_CARE_PROVIDER_SITE_OTHER): Payer: Medicare Other | Admitting: Ophthalmology

## 2013-06-23 DIAGNOSIS — E11311 Type 2 diabetes mellitus with unspecified diabetic retinopathy with macular edema: Secondary | ICD-10-CM

## 2013-06-23 DIAGNOSIS — I10 Essential (primary) hypertension: Secondary | ICD-10-CM

## 2013-06-23 DIAGNOSIS — H43819 Vitreous degeneration, unspecified eye: Secondary | ICD-10-CM

## 2013-06-23 DIAGNOSIS — E11359 Type 2 diabetes mellitus with proliferative diabetic retinopathy without macular edema: Secondary | ICD-10-CM

## 2013-06-23 DIAGNOSIS — E1065 Type 1 diabetes mellitus with hyperglycemia: Secondary | ICD-10-CM

## 2013-06-23 DIAGNOSIS — H35039 Hypertensive retinopathy, unspecified eye: Secondary | ICD-10-CM

## 2013-06-23 DIAGNOSIS — E1039 Type 1 diabetes mellitus with other diabetic ophthalmic complication: Secondary | ICD-10-CM

## 2013-08-04 ENCOUNTER — Encounter (INDEPENDENT_AMBULATORY_CARE_PROVIDER_SITE_OTHER): Payer: Medicare Other | Admitting: Ophthalmology

## 2013-08-04 DIAGNOSIS — E11311 Type 2 diabetes mellitus with unspecified diabetic retinopathy with macular edema: Secondary | ICD-10-CM

## 2013-08-04 DIAGNOSIS — H35039 Hypertensive retinopathy, unspecified eye: Secondary | ICD-10-CM

## 2013-08-04 DIAGNOSIS — E1065 Type 1 diabetes mellitus with hyperglycemia: Secondary | ICD-10-CM

## 2013-08-04 DIAGNOSIS — E1039 Type 1 diabetes mellitus with other diabetic ophthalmic complication: Secondary | ICD-10-CM

## 2013-08-04 DIAGNOSIS — E11359 Type 2 diabetes mellitus with proliferative diabetic retinopathy without macular edema: Secondary | ICD-10-CM

## 2013-08-04 DIAGNOSIS — H43819 Vitreous degeneration, unspecified eye: Secondary | ICD-10-CM

## 2013-08-04 DIAGNOSIS — I10 Essential (primary) hypertension: Secondary | ICD-10-CM

## 2013-08-18 ENCOUNTER — Ambulatory Visit (INDEPENDENT_AMBULATORY_CARE_PROVIDER_SITE_OTHER): Payer: Medicare Other | Admitting: Ophthalmology

## 2013-09-08 ENCOUNTER — Emergency Department (HOSPITAL_COMMUNITY)
Admission: EM | Admit: 2013-09-08 | Discharge: 2013-09-08 | Disposition: A | Discharge status: Home / Self Care | Payer: Medicare Other | Attending: Emergency Medicine | Admitting: Emergency Medicine

## 2013-09-08 ENCOUNTER — Encounter (HOSPITAL_COMMUNITY): Payer: Self-pay | Admitting: Emergency Medicine

## 2013-09-08 DIAGNOSIS — E119 Type 2 diabetes mellitus without complications: Secondary | ICD-10-CM | POA: Insufficient documentation

## 2013-09-08 DIAGNOSIS — Z79899 Other long term (current) drug therapy: Secondary | ICD-10-CM | POA: Diagnosis not present

## 2013-09-08 DIAGNOSIS — Z792 Long term (current) use of antibiotics: Secondary | ICD-10-CM | POA: Diagnosis not present

## 2013-09-08 DIAGNOSIS — K9423 Gastrostomy malfunction: Secondary | ICD-10-CM | POA: Diagnosis present

## 2013-09-08 DIAGNOSIS — Z8739 Personal history of other diseases of the musculoskeletal system and connective tissue: Secondary | ICD-10-CM | POA: Insufficient documentation

## 2013-09-08 DIAGNOSIS — Z85028 Personal history of other malignant neoplasm of stomach: Secondary | ICD-10-CM | POA: Diagnosis not present

## 2013-09-08 DIAGNOSIS — E669 Obesity, unspecified: Secondary | ICD-10-CM | POA: Diagnosis not present

## 2013-09-08 DIAGNOSIS — Z87891 Personal history of nicotine dependence: Secondary | ICD-10-CM | POA: Insufficient documentation

## 2013-09-08 DIAGNOSIS — Z862 Personal history of diseases of the blood and blood-forming organs and certain disorders involving the immune mechanism: Secondary | ICD-10-CM | POA: Diagnosis not present

## 2013-09-08 DIAGNOSIS — Z91040 Latex allergy status: Secondary | ICD-10-CM | POA: Diagnosis not present

## 2013-09-08 DIAGNOSIS — Z794 Long term (current) use of insulin: Secondary | ICD-10-CM | POA: Insufficient documentation

## 2013-09-08 DIAGNOSIS — K9413 Enterostomy malfunction: Secondary | ICD-10-CM

## 2013-09-08 DIAGNOSIS — I10 Essential (primary) hypertension: Secondary | ICD-10-CM | POA: Insufficient documentation

## 2013-09-08 LAB — COMPREHENSIVE METABOLIC PANEL
ALBUMIN: 2.8 g/dL — AB (ref 3.5–5.2)
ALT: 9 U/L (ref 0–35)
AST: 13 U/L (ref 0–37)
Alkaline Phosphatase: 43 U/L (ref 39–117)
Anion gap: 12 (ref 5–15)
BUN: 22 mg/dL (ref 6–23)
CALCIUM: 9.1 mg/dL (ref 8.4–10.5)
CHLORIDE: 110 meq/L (ref 96–112)
CO2: 23 mEq/L (ref 19–32)
CREATININE: 0.8 mg/dL (ref 0.50–1.10)
GFR calc Af Amer: >90 mL/min (ref >90)
GFR calc non Af Amer: 80 mL/min — ABNORMAL LOW (ref >90)
Glucose, Bld: 83 mg/dL (ref 70–99)
Potassium: 4.2 mEq/L (ref 3.7–5.3)
SODIUM: 145 meq/L (ref 137–147)
Total Protein: 6 g/dL (ref 6.0–8.3)

## 2013-09-08 LAB — CBC
HCT: 33.4 % — ABNORMAL LOW (ref 36.0–46.0)
Hemoglobin: 10.4 g/dL — ABNORMAL LOW (ref 12.0–15.0)
MCH: 27.2 pg (ref 26.0–34.0)
MCHC: 31.1 g/dL (ref 30.0–36.0)
MCV: 87.4 fL (ref 78.0–100.0)
PLATELETS: 194 10*3/uL (ref 150–400)
RBC: 3.82 MIL/uL — ABNORMAL LOW (ref 3.87–5.11)
RDW: 19 % — AB (ref 11.5–15.5)
WBC: 4.5 10*3/uL (ref 4.0–10.5)

## 2013-09-08 MED ORDER — SODIUM CHLORIDE 0.9 % IJ SOLN
10.0000 mL | INTRAMUSCULAR | Status: DC | PRN
  Administered 2013-09-08: 10 mL

## 2013-09-08 MED ORDER — HEPARIN SOD (PORK) LOCK FLUSH 100 UNIT/ML IV SOLN
500.0000 [IU] | INTRAVENOUS | Status: AC | PRN
  Administered 2013-09-08: 500 [IU]

## 2013-09-08 NOTE — ED Notes (Signed)
Spoke with the IV nurse, they will be down to access a port.

## 2013-09-08 NOTE — ED Notes (Signed)
Pt placed in gown and in bed. PT monitored by bp cuff and pulse ox. Not heart monitor (per nurse).

## 2013-09-08 NOTE — ED Notes (Signed)
Per pt sts yesterday she began having issues with her feeding tube. sts that when she takes things in PO it leaks around the tube. sts also tender and warm.

## 2013-09-08 NOTE — ED Notes (Signed)
Pt. Also reports that she had not had any solid food since Saturday,. ahe attempted but she vomited  She attempted to eat cereal on Sunday, but it appeared to come out around her tube.  She also is trying to have pop sicles but they also leak around her peg tube

## 2013-09-08 NOTE — ED Notes (Signed)
Bagged lunches given to pt;s family .

## 2013-09-09 ENCOUNTER — Ambulatory Visit (HOSPITAL_COMMUNITY)
Admission: RE | Admit: 2013-09-09 | Discharge: 2013-09-09 | Disposition: A | Discharge status: Home / Self Care | Payer: Medicare Other | Source: Ambulatory Visit | Attending: Interventional Radiology | Admitting: Interventional Radiology

## 2013-09-09 ENCOUNTER — Other Ambulatory Visit (HOSPITAL_COMMUNITY): Payer: Self-pay | Admitting: Interventional Radiology

## 2013-09-09 DIAGNOSIS — R1310 Dysphagia, unspecified: Secondary | ICD-10-CM

## 2013-09-09 DIAGNOSIS — Z434 Encounter for attention to other artificial openings of digestive tract: Secondary | ICD-10-CM | POA: Diagnosis present

## 2013-09-09 MED ORDER — IOHEXOL 300 MG/ML  SOLN
50.0000 mL | Freq: Once | INTRAMUSCULAR | Status: AC | PRN
  Administered 2013-09-09: 20 mL via INTRAVENOUS

## 2013-09-09 NOTE — ED Provider Notes (Signed)
CSN: [id number]     Arrival date & time 09/08/13  1359 History   First MD Initiated Contact with Patient 09/08/13 1708     Chief Complaint  Patient presents with  . Feeding Intolerance  . feeding tube issue      (Consider location/radiation/quality/duration/timing/severity/associated sxs/prior Treatment) Patient is a 58 y.o. female presenting with general illness.  Illness Location:  Abdomen Quality:  Feeding tube leakage Severity:  Mild Onset quality:  Sudden Duration:  2 days Timing:  Constant Chronicity:  New Context:  Recent J-tube replacement approx 2 weeks ago Relieved by:  None Associated symptoms: no abdominal pain, no chest pain, no cough, no diarrhea, no fatigue, no fever, no headaches, no loss of consciousness, no nausea, no rash, no rhinorrhea, no shortness of breath, no sore throat and no vomiting     Past Medical History  Diagnosis Date  . Diabetes mellitus   . Hypertension   . Hypercholesterolemia   . Anemia 08/02/2011  . Obesity   . S/P left knee arthroscopy   . Arthritis of knee     Osteoarthritis of right knee  . 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 Tubaligation  1981  . Endoscopy  4/7/214  . Esophagogastroduodenoscopy      Numerous procedures at [facility], related to stomach cancer. Dr. Arliss Journey, 06/19/12, 06/21/12; Dr. Roni Bread, 09/05/12; Dr. Tillie Rung, 09/13/12, with stent placement; Dr. Jola Schmidt, 12/10, with stent revision  . Other surgical history      Endoscopic stent placement secondary to stomach cancer, 09/13/12, revised 01/22/13  . Gastrectomy  09/11/12    Distal, with gastroduodenostomy; Dr. Cristino Martes, Jr  . 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 Cristino Martes, Jr  . Knee arthroscopy Right 2007  . Laparoscopy  11/06/2012    Dr. Cristino Martes, [place]; ileostomy / jejunostomy   Family History  Problem Relation Age of Onset  . Diabetes type II Father   .  Diabetes Mother   . Diabetes Sister   . Diabetes Brother    History  Substance Use Topics  . Smoking status: Former Smoker    Quit date: 02/14/1996  . Smokeless tobacco: Never Used  . Alcohol Use: No   OB History   Grav Para Term Preterm Abortions TAB SAB Ect Mult Living                 Review of Systems  Constitutional: Negative for fever, chills and fatigue.  HENT: Negative for rhinorrhea and sore throat.   Eyes: Negative for pain.  Respiratory: Negative for cough and shortness of breath.   Cardiovascular: Negative for chest pain.  Gastrointestinal: Negative for nausea, vomiting, abdominal pain and diarrhea.  Genitourinary: Negative for dysuria.  Musculoskeletal: Negative for back pain.  Skin: Negative for rash.  Neurological: Negative for loss of consciousness, numbness and headaches.      Allergies  Latex  Home Medications   Prior to Admission medications   Medication Sig Start Date End Date Taking? Authorizing Provider  alum & mag hydroxide-simeth (MAALOX/MYLANTA) 200-200-20 MG/5ML suspension Give 20 mLs by tube daily as needed for indigestion. OTC "Comfort Gel" generic for Mylanta   Yes Historical Provider, MD  amitriptyline (ELAVIL) 25 MG tablet Take 25 mg by mouth at bedtime.    Yes Historical Provider, MD  cyclobenzaprine (FLEXERIL) 10 MG tablet Take 10 mg by mouth 3 (three) times daily as needed for  muscle spasms.  05/29/13  Yes Historical Provider, MD  estradiol (ESTRACE VAGINAL) 0.1 MG/GM vaginal cream 1 applicator full at bedtime 3 times a week than taper to twice weekly and then once weekly 04/18/13  Yes Peggy Constant, MD  HYDROmorphone HCl (DILAUDID) 1 MG/ML LIQD 2 mLs (2 mg total) by Via Tube route every 3 (three) hours as needed (pain). 04/17/13  Yes Historical Provider, MD  insulin NPH Human (HUMULIN N,NOVOLIN N) 100 UNIT/ML injection Inject 5 Units into the skin 2 (two) times daily before a meal.    Yes Historical Provider, MD  omeprazole (PRILOSEC) 2 mg/mL  SUSP Place 20 mLs (40 mg total) into feeding tube daily as needed (heartburn or acid reflux). 06/16/13  Yes Sharon [address], MD  ondansetron (ZOFRAN) 8 MG tablet Take 8 mg by mouth every 8 (eight) hours as needed for nausea or vomiting.  02/25/13 02/25/14 Yes Historical Provider, MD  oxyCODONE (ROXICODONE) 5 MG/5ML solution Give 5 mg by tube 2 (two) times daily as needed for pain.    Yes Historical Provider, MD  polyethylene glycol (MIRALAX / GLYCOLAX) packet Take 17 g by mouth daily.  02/17/13  Yes Historical Provider, MD  enoxaparin (LOVENOX) 150 MG/ML injection Inject 150 mg into the skin daily.  04/17/13 05/17/13  Historical Provider, MD   BP 144/77  Pulse 86  Temp(Src) 97.9 F (36.6 C) (Oral)  Resp 16  SpO2 100% Physical Exam  Constitutional: She is oriented to person, place, and time. She appears well-developed and well-nourished. She appears ill. No distress.  HENT:  Head: Normocephalic and atraumatic.  Eyes: Pupils are equal, round, and reactive to light. Right eye exhibits no discharge. Left eye exhibits no discharge.  Neck: Normal range of motion.  Cardiovascular: Normal rate, regular rhythm and normal heart sounds.   Pulmonary/Chest: Effort normal and breath sounds normal.  Abdominal: Soft. She exhibits no distension. There is no tenderness.  G tube in place, without complications. J-tube site with surrounding mild irritation, leakage of yellow-ish fluid.   Musculoskeletal: Normal range of motion.  Neurological: She is alert and oriented to person, place, and time.  Skin: Skin is warm. She is not diaphoretic.    ED Course  Procedures (including critical care time) Labs Review Labs Reviewed  CBC - Abnormal; Notable for the following:    RBC 3.82 (*)    Hemoglobin 10.4 (*)    HCT 33.4 (*)    RDW 19.0 (*)    All other components within normal limits  COMPREHENSIVE METABOLIC PANEL - Abnormal; Notable for the following:    Albumin 2.8 (*)    Total Bilirubin <0.2 (*)    GFR  calc non Af Amer 80 (*)    All other components within normal limits    Imaging Review No results found.   EKG Interpretation None      MDM   Final diagnoses:  Jejunostomy malfunction   58 yo pleasant F with hx of DM, HTN, HLD, gastric cancer with G-tube, J-tube, that presents with several day history of J-tube leakage.   Patient HDS upon arrival, in NAD. AFVSS. Patient with recent chemotherapy at [facility], with recent J-tube exchange on 08/29/13. Now leaking around the site. PE demonstrates fluid leakage around the site. Patient does not need emergent replacement, but will place order for IR intervention tomorrow. Discussed with patient and family, and they are comfortable with this plan. Discharged in stable condition. Patient seen and evaluated by myself and my attending, Dr. Tomi Bamberger.  Freddi Che, MD 09/09/13 1013

## 2013-09-10 NOTE — ED Provider Notes (Signed)
I saw and evaluated the patient, reviewed the resident's note and I agree with the findings and plan.  Feeding tube with contents most consistent with intestinal contents.  No purulent drainage.  Contacted IR to arrange for close follow up  Dorie Rank, MD 09/10/13 1845

## 2013-09-15 ENCOUNTER — Encounter (INDEPENDENT_AMBULATORY_CARE_PROVIDER_SITE_OTHER): Payer: Medicare Other | Admitting: Ophthalmology

## 2013-09-16 ENCOUNTER — Encounter: Payer: Self-pay | Admitting: *Deleted

## 2013-09-16 DIAGNOSIS — K219 Gastro-esophageal reflux disease without esophagitis: Secondary | ICD-10-CM

## 2013-09-16 NOTE — Progress Notes (Signed)
Prior Authorization received from [place] for Omeprazole suspension.  PA form placed in provider box for completion. Derl Barrow, RN

## 2013-09-17 DIAGNOSIS — K219 Gastro-esophageal reflux disease without esophagitis: Secondary | ICD-10-CM | POA: Insufficient documentation

## 2013-09-17 NOTE — Progress Notes (Signed)
Prior auth completed; continuation of therapy. Indication is GERD (530.81). 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 Prior auth paperwork given to Candiss Norse, RN. --CMS

## 2013-09-18 NOTE — Progress Notes (Signed)
Received fax from [facility] stating PA was cancelled; unable to identify the medication.  Pt was contact and stated she has an appt tomorrow at [facility] and will try and ask if they could refill medication.  Katie Barrow, RN

## 2013-09-18 NOTE — Progress Notes (Signed)
PA faxed 09/17/2013 to [facility] for review.  Derl Barrow, RN

## 2013-09-25 ENCOUNTER — Encounter (INDEPENDENT_AMBULATORY_CARE_PROVIDER_SITE_OTHER): Payer: Medicare Other | Admitting: Ophthalmology

## 2013-10-24 ENCOUNTER — Encounter (INDEPENDENT_AMBULATORY_CARE_PROVIDER_SITE_OTHER): Payer: Medicare Other | Admitting: Ophthalmology

## 2013-10-24 DIAGNOSIS — E11359 Type 2 diabetes mellitus with proliferative diabetic retinopathy without macular edema: Secondary | ICD-10-CM

## 2013-10-24 DIAGNOSIS — H35039 Hypertensive retinopathy, unspecified eye: Secondary | ICD-10-CM

## 2013-10-24 DIAGNOSIS — E11311 Type 2 diabetes mellitus with unspecified diabetic retinopathy with macular edema: Secondary | ICD-10-CM

## 2013-10-24 DIAGNOSIS — H43819 Vitreous degeneration, unspecified eye: Secondary | ICD-10-CM

## 2013-10-24 DIAGNOSIS — E1065 Type 1 diabetes mellitus with hyperglycemia: Secondary | ICD-10-CM

## 2013-10-24 DIAGNOSIS — E1039 Type 1 diabetes mellitus with other diabetic ophthalmic complication: Secondary | ICD-10-CM

## 2013-10-24 DIAGNOSIS — I10 Essential (primary) hypertension: Secondary | ICD-10-CM

## 2013-10-24 DIAGNOSIS — H2510 Age-related nuclear cataract, unspecified eye: Secondary | ICD-10-CM

## 2013-12-05 ENCOUNTER — Encounter (INDEPENDENT_AMBULATORY_CARE_PROVIDER_SITE_OTHER): Payer: Medicare Other | Admitting: Ophthalmology

## 2013-12-05 DIAGNOSIS — H34813 Central retinal vein occlusion, bilateral: Secondary | ICD-10-CM

## 2013-12-05 DIAGNOSIS — E10351 Type 1 diabetes mellitus with proliferative diabetic retinopathy with macular edema: Secondary | ICD-10-CM

## 2013-12-05 DIAGNOSIS — H35033 Hypertensive retinopathy, bilateral: Secondary | ICD-10-CM

## 2013-12-05 DIAGNOSIS — E10311 Type 1 diabetes mellitus with unspecified diabetic retinopathy with macular edema: Secondary | ICD-10-CM

## 2013-12-05 DIAGNOSIS — E10359 Type 1 diabetes mellitus with proliferative diabetic retinopathy without macular edema: Secondary | ICD-10-CM

## 2013-12-05 DIAGNOSIS — I10 Essential (primary) hypertension: Secondary | ICD-10-CM

## 2013-12-26 ENCOUNTER — Telehealth: Payer: Self-pay | Admitting: Family Medicine

## 2013-12-26 NOTE — Telephone Encounter (Signed)
Received letter from Dr. Joellyn Haff office (pt's GI doc at [facility], phone [phone] --> option 1 to speak to CMA, option 2 to speak with Sherryll Burger), stating results from recent endoscopic biopsy are back but letter does not specify what results are. Left message on CMA's voice mail requesting copy of results to be sent to me. Letter states copy was also sent to Dr. Fanny Skates (oncologist at [facility]) and pt may have already contacted them, but will wait for results then f/u as needed. Letter to be scanned into pt's chart. --CMS

## 2013-12-29 NOTE — Telephone Encounter (Signed)
Received fax from [place], endoscopy results and office note; pt with known stage IV stomach cancer. EGD showeed malignant-appearing distal esophageal setenosis, biopy showeing adenocarcinoma. Pt meeting regularly with Oncology and to change chemo from Taxol to FOLFIRI, with anticipated esophageal stent placement 11/17. Will f/u as needed.

## 2014-02-13 DEATH — deceased

## 2014-02-17 ENCOUNTER — Encounter (INDEPENDENT_AMBULATORY_CARE_PROVIDER_SITE_OTHER): Payer: Medicare Other | Admitting: Ophthalmology

## 2015-03-10 ENCOUNTER — Encounter: Payer: Self-pay | Admitting: Gastroenterology

## 2018-11-18 ENCOUNTER — Encounter: Payer: Self-pay | Admitting: Gastroenterology
